# Patient Record
Sex: Male | Born: 1935 | Race: White | Hispanic: No | Marital: Married | Smoking: Former smoker
Health system: Southern US, Community
[De-identification: ages and names within clinical notes are randomized; demographics above are authoritative.]

## PROBLEM LIST (undated history)

## (undated) DIAGNOSIS — C61 Malignant neoplasm of prostate: Secondary | ICD-10-CM

## (undated) DIAGNOSIS — Z8719 Personal history of other diseases of the digestive system: Secondary | ICD-10-CM

## (undated) DIAGNOSIS — K219 Gastro-esophageal reflux disease without esophagitis: Secondary | ICD-10-CM

## (undated) DIAGNOSIS — Z87442 Personal history of urinary calculi: Secondary | ICD-10-CM

## (undated) DIAGNOSIS — M810 Age-related osteoporosis without current pathological fracture: Secondary | ICD-10-CM

## (undated) DIAGNOSIS — R519 Headache, unspecified: Secondary | ICD-10-CM

## (undated) DIAGNOSIS — G629 Polyneuropathy, unspecified: Secondary | ICD-10-CM

## (undated) DIAGNOSIS — K439 Ventral hernia without obstruction or gangrene: Secondary | ICD-10-CM

## (undated) DIAGNOSIS — I8393 Asymptomatic varicose veins of bilateral lower extremities: Secondary | ICD-10-CM

## (undated) DIAGNOSIS — R51 Headache: Secondary | ICD-10-CM

## (undated) HISTORY — PX: EYE SURGERY: SHX253

## (undated) HISTORY — PX: LAPAROSCOPIC CHOLECYSTECTOMY: SUR755

## (undated) HISTORY — PX: FRACTURE SURGERY: SHX138

## (undated) HISTORY — PX: TRANSURETHRAL RESECTION OF PROSTATE: SHX73

## (undated) HISTORY — PX: HERNIA REPAIR: SHX51

## (undated) HISTORY — PX: UMBILICAL HERNIA REPAIR: SHX196

## (undated) HISTORY — PX: KNEE ARTHROSCOPY: SHX127

## (undated) HISTORY — PX: CORNEAL TRANSPLANT: SHX108

## (undated) HISTORY — PX: CATARACT EXTRACTION, BILATERAL: SHX1313

## (undated) HISTORY — PX: THYROID SURGERY: SHX805

---

## 1993-08-10 HISTORY — PX: WRIST FRACTURE SURGERY: SHX121

## 2001-08-10 HISTORY — PX: PROSTATECTOMY: SHX69

## 2018-01-18 ENCOUNTER — Inpatient Hospital Stay (HOSPITAL_BASED_OUTPATIENT_CLINIC_OR_DEPARTMENT_OTHER)
Admission: EM | Admit: 2018-01-18 | Discharge: 2018-01-22 | DRG: 964 | Disposition: A | Discharge status: Skilled Nursing Facility | Payer: Medicare HMO | Attending: Surgery | Admitting: Surgery

## 2018-01-18 ENCOUNTER — Emergency Department (HOSPITAL_BASED_OUTPATIENT_CLINIC_OR_DEPARTMENT_OTHER): Payer: Medicare HMO

## 2018-01-18 ENCOUNTER — Other Ambulatory Visit: Payer: Self-pay

## 2018-01-18 ENCOUNTER — Encounter (HOSPITAL_BASED_OUTPATIENT_CLINIC_OR_DEPARTMENT_OTHER): Payer: Self-pay | Admitting: *Deleted

## 2018-01-18 DIAGNOSIS — H5461 Unqualified visual loss, right eye, normal vision left eye: Secondary | ICD-10-CM | POA: Diagnosis present

## 2018-01-18 DIAGNOSIS — S270XXA Traumatic pneumothorax, initial encounter: Secondary | ICD-10-CM

## 2018-01-18 DIAGNOSIS — Z87891 Personal history of nicotine dependence: Secondary | ICD-10-CM

## 2018-01-18 DIAGNOSIS — I480 Paroxysmal atrial fibrillation: Secondary | ICD-10-CM | POA: Diagnosis not present

## 2018-01-18 DIAGNOSIS — I5030 Unspecified diastolic (congestive) heart failure: Secondary | ICD-10-CM | POA: Diagnosis not present

## 2018-01-18 DIAGNOSIS — G629 Polyneuropathy, unspecified: Secondary | ICD-10-CM | POA: Diagnosis present

## 2018-01-18 DIAGNOSIS — Z9842 Cataract extraction status, left eye: Secondary | ICD-10-CM

## 2018-01-18 DIAGNOSIS — Z947 Corneal transplant status: Secondary | ICD-10-CM | POA: Diagnosis not present

## 2018-01-18 DIAGNOSIS — Y92002 Bathroom of unspecified non-institutional (private) residence single-family (private) house as the place of occurrence of the external cause: Secondary | ICD-10-CM

## 2018-01-18 DIAGNOSIS — Z8546 Personal history of malignant neoplasm of prostate: Secondary | ICD-10-CM | POA: Diagnosis not present

## 2018-01-18 DIAGNOSIS — Z885 Allergy status to narcotic agent status: Secondary | ICD-10-CM

## 2018-01-18 DIAGNOSIS — S36114A Minor laceration of liver, initial encounter: Principal | ICD-10-CM | POA: Diagnosis present

## 2018-01-18 DIAGNOSIS — Z9079 Acquired absence of other genital organ(s): Secondary | ICD-10-CM | POA: Diagnosis not present

## 2018-01-18 DIAGNOSIS — J90 Pleural effusion, not elsewhere classified: Secondary | ICD-10-CM | POA: Diagnosis present

## 2018-01-18 DIAGNOSIS — Z79899 Other long term (current) drug therapy: Secondary | ICD-10-CM

## 2018-01-18 DIAGNOSIS — W1830XA Fall on same level, unspecified, initial encounter: Secondary | ICD-10-CM | POA: Diagnosis present

## 2018-01-18 DIAGNOSIS — Z7982 Long term (current) use of aspirin: Secondary | ICD-10-CM | POA: Diagnosis not present

## 2018-01-18 DIAGNOSIS — Z9841 Cataract extraction status, right eye: Secondary | ICD-10-CM

## 2018-01-18 DIAGNOSIS — S2249XA Multiple fractures of ribs, unspecified side, initial encounter for closed fracture: Secondary | ICD-10-CM | POA: Diagnosis present

## 2018-01-18 DIAGNOSIS — I4891 Unspecified atrial fibrillation: Secondary | ICD-10-CM | POA: Diagnosis not present

## 2018-01-18 DIAGNOSIS — Z9049 Acquired absence of other specified parts of digestive tract: Secondary | ICD-10-CM | POA: Diagnosis not present

## 2018-01-18 DIAGNOSIS — J939 Pneumothorax, unspecified: Secondary | ICD-10-CM

## 2018-01-18 DIAGNOSIS — S2241XA Multiple fractures of ribs, right side, initial encounter for closed fracture: Secondary | ICD-10-CM | POA: Diagnosis present

## 2018-01-18 DIAGNOSIS — I714 Abdominal aortic aneurysm, without rupture: Secondary | ICD-10-CM | POA: Diagnosis present

## 2018-01-18 DIAGNOSIS — W19XXXA Unspecified fall, initial encounter: Secondary | ICD-10-CM

## 2018-01-18 DIAGNOSIS — G609 Hereditary and idiopathic neuropathy, unspecified: Secondary | ICD-10-CM | POA: Diagnosis not present

## 2018-01-18 DIAGNOSIS — R296 Repeated falls: Secondary | ICD-10-CM | POA: Diagnosis not present

## 2018-01-18 HISTORY — DX: Polyneuropathy, unspecified: G62.9

## 2018-01-18 HISTORY — DX: Malignant neoplasm of prostate: C61

## 2018-01-18 HISTORY — DX: Headache, unspecified: R51.9

## 2018-01-18 HISTORY — DX: Gastro-esophageal reflux disease without esophagitis: K21.9

## 2018-01-18 HISTORY — DX: Personal history of urinary calculi: Z87.442

## 2018-01-18 HISTORY — DX: Age-related osteoporosis without current pathological fracture: M81.0

## 2018-01-18 HISTORY — DX: Headache: R51

## 2018-01-18 HISTORY — DX: Ventral hernia without obstruction or gangrene: K43.9

## 2018-01-18 HISTORY — DX: Asymptomatic varicose veins of bilateral lower extremities: I83.93

## 2018-01-18 HISTORY — DX: Personal history of other diseases of the digestive system: Z87.19

## 2018-01-18 LAB — CBC WITH DIFFERENTIAL/PLATELET
Basophils Absolute: 0 10*3/uL (ref 0.0–0.1)
Basophils Relative: 0 %
EOS PCT: 0 %
Eosinophils Absolute: 0 10*3/uL (ref 0.0–0.7)
HCT: 43.3 % (ref 39.0–52.0)
Hemoglobin: 15.4 g/dL (ref 13.0–17.0)
LYMPHS PCT: 6 %
Lymphs Abs: 0.6 10*3/uL — ABNORMAL LOW (ref 0.7–4.0)
MCH: 31.9 pg (ref 26.0–34.0)
MCHC: 35.6 g/dL (ref 30.0–36.0)
MCV: 89.6 fL (ref 78.0–100.0)
Monocytes Absolute: 0.7 10*3/uL (ref 0.1–1.0)
Monocytes Relative: 6 %
Neutro Abs: 9.4 10*3/uL — ABNORMAL HIGH (ref 1.7–7.7)
Neutrophils Relative %: 88 %
PLATELETS: 185 10*3/uL (ref 150–400)
RBC: 4.83 MIL/uL (ref 4.22–5.81)
RDW: 12.9 % (ref 11.5–15.5)
WBC: 10.7 10*3/uL — AB (ref 4.0–10.5)

## 2018-01-18 LAB — COMPREHENSIVE METABOLIC PANEL
ALK PHOS: 64 U/L (ref 38–126)
ALT: 18 U/L (ref 17–63)
ANION GAP: 7 (ref 5–15)
AST: 26 U/L (ref 15–41)
Albumin: 4 g/dL (ref 3.5–5.0)
BUN: 13 mg/dL (ref 6–20)
CALCIUM: 8.9 mg/dL (ref 8.9–10.3)
CHLORIDE: 105 mmol/L (ref 101–111)
CO2: 28 mmol/L (ref 22–32)
CREATININE: 0.94 mg/dL (ref 0.61–1.24)
Glucose, Bld: 116 mg/dL — ABNORMAL HIGH (ref 65–99)
Potassium: 3.9 mmol/L (ref 3.5–5.1)
Sodium: 140 mmol/L (ref 135–145)
Total Bilirubin: 0.8 mg/dL (ref 0.3–1.2)
Total Protein: 7 g/dL (ref 6.5–8.1)

## 2018-01-18 LAB — URINALYSIS, ROUTINE W REFLEX MICROSCOPIC
BILIRUBIN URINE: NEGATIVE
Glucose, UA: NEGATIVE mg/dL
Ketones, ur: NEGATIVE mg/dL
Leukocytes, UA: NEGATIVE
Nitrite: NEGATIVE
PROTEIN: NEGATIVE mg/dL
Specific Gravity, Urine: 1.02 (ref 1.005–1.030)
pH: 7 (ref 5.0–8.0)

## 2018-01-18 LAB — URINALYSIS, MICROSCOPIC (REFLEX): WBC, UA: NONE SEEN WBC/hpf (ref 0–5)

## 2018-01-18 LAB — I-STAT CG4 LACTIC ACID, ED: LACTIC ACID, VENOUS: 0.89 mmol/L (ref 0.5–1.9)

## 2018-01-18 MED ORDER — MORPHINE SULFATE (PF) 2 MG/ML IV SOLN
2.0000 mg | INTRAVENOUS | Status: DC | PRN
  Administered 2018-01-19: 2 mg via INTRAVENOUS
  Filled 2018-01-18: qty 1

## 2018-01-18 MED ORDER — MORPHINE SULFATE (PF) 4 MG/ML IV SOLN
4.0000 mg | Freq: Once | INTRAVENOUS | Status: AC
  Administered 2018-01-18: 4 mg via INTRAVENOUS
  Filled 2018-01-18: qty 1

## 2018-01-18 MED ORDER — IOPAMIDOL (ISOVUE-300) INJECTION 61%
100.0000 mL | Freq: Once | INTRAVENOUS | Status: AC | PRN
  Administered 2018-01-18: 100 mL via INTRAVENOUS

## 2018-01-18 MED ORDER — ONDANSETRON 4 MG PO TBDP: 4.0000 mg | ORAL_TABLET | Freq: Four times a day (QID) | ORAL | Status: DC | PRN

## 2018-01-18 MED ORDER — MORPHINE SULFATE (PF) 2 MG/ML IV SOLN: 1.0000 mg | INTRAVENOUS | Status: DC | PRN

## 2018-01-18 MED ORDER — ONDANSETRON HCL 4 MG/2ML IJ SOLN
4.0000 mg | Freq: Once | INTRAMUSCULAR | Status: AC
  Administered 2018-01-18: 4 mg via INTRAVENOUS
  Filled 2018-01-18: qty 2

## 2018-01-18 MED ORDER — POTASSIUM CHLORIDE IN NACL 20-0.9 MEQ/L-% IV SOLN
INTRAVENOUS | Status: DC
  Administered 2018-01-19: via INTRAVENOUS
  Filled 2018-01-18: qty 1000

## 2018-01-18 MED ORDER — ONDANSETRON HCL 4 MG/2ML IJ SOLN: 4.0000 mg | Freq: Four times a day (QID) | INTRAMUSCULAR | Status: DC | PRN

## 2018-01-18 MED ORDER — CYCLOSPORINE 0.05 % OP EMUL
1.0000 [drp] | Freq: Two times a day (BID) | OPHTHALMIC | Status: DC
  Administered 2018-01-20 – 2018-01-22 (×2): 1 [drp] via OPHTHALMIC
  Filled 2018-01-18 (×9): qty 1

## 2018-01-18 MED ORDER — DULOXETINE HCL 60 MG PO CPEP
60.0000 mg | ORAL_CAPSULE | Freq: Every day | ORAL | Status: DC
  Administered 2018-01-19 – 2018-01-22 (×4): 60 mg via ORAL
  Filled 2018-01-18 (×5): qty 1

## 2018-01-18 MED ORDER — MORPHINE SULFATE (PF) 4 MG/ML IV SOLN: 4.0000 mg | INTRAVENOUS | Status: DC | PRN

## 2018-01-18 NOTE — ED Provider Notes (Signed)
[facility] EMERGENCY DEPARTMENT Provider Note   CSN: [id number] Arrival date & time: 01/18/18  1547     History   Chief Complaint Chief Complaint  Patient presents with  . Fall    HPI Dustin Fruth Sr. is a 84 y.o. male.  HPI   The history is provided by the patient, medical records and the spouse.  Fall  This is a new problem. The current episode started less than 1 hour ago. The problem occurs constantly. The problem has not changed since onset.Pertinent negatives include no chest pain, no abdominal pain, no headaches and no shortness of breath. The symptoms are aggravated by twisting and bending. Nothing relieves the symptoms. He has tried nothing for the symptoms. The treatment provided no relief.      Past Medical History:  Diagnosis Date  . Peripheral neuropathy   . Prostate CA [facility])     Patient Active Problem List   Diagnosis Date Noted  . Multiple rib fractures 01/18/2018  . Liver laceration, grade I 01/18/2018    Past Surgical History:  Procedure Laterality Date  . CHOLECYSTECTOMY    . PROSTATECTOMY    . THYROID SURGERY          Home Medications    Prior to Admission medications   Not on File    Family History No family history on file.  Social History Social History   Tobacco Use  . Smoking status: Not on file  Substance Use Topics  . Alcohol use: Not on file  . Drug use: Not on file     Allergies   Patient has no allergy information on record.   Review of Systems Review of Systems Review of Systems  Constitutional: Negative for chills, fatigue and fever.  HENT: Negative for congestion.   Respiratory: Negative for cough, chest tightness, shortness of breath and wheezing.   Cardiovascular: Negative for chest pain.  Gastrointestinal: Negative for abdominal pain, diarrhea, nausea and vomiting.  Genitourinary: Positive for flank pain. Negative for frequency.  Musculoskeletal: Positive for back pain. Negative for  neck stiffness.  Skin: Negative for rash and wound.  Neurological: Negative for light-headedness, numbness and headaches.  Psychiatric/Behavioral: Negative for agitation.  All other systems reviewed and are negative.   Physical Exam Updated Vital Signs BP (!) 187/124 (BP Location: Right Arm)   Pulse 96   Temp 98.3 F (36.8 C) (Oral)   Resp 20   Ht 6' (1.829 m)   Wt 108.9 kg (240 lb)   SpO2 92%   BMI 32.55 kg/m   Physical Exam Constitutional: He is oriented to person, place, and time. He appears well-developed and well-nourished. No distress.  HENT:  Head: Normocephalic and atraumatic.  Mouth/Throat: Oropharynx is clear and moist. No oropharyngeal exudate.  Eyes: Conjunctivae and EOM are normal.  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  Neck: Normal range of motion. Neck supple.  Cardiovascular: Normal rate and regular rhythm.  No murmur heard. Pulmonary/Chest: Effort normal and breath sounds normal. No stridor. No respiratory distress. He has no wheezes. He has no rales. He exhibits no tenderness.  Abdominal: Soft. Normal appearance. There is no tenderness. There is CVA tenderness. There is no guarding.    Musculoskeletal: He exhibits tenderness. He exhibits no edema.       Lumbar back: He exhibits tenderness, swelling, pain and spasm.       Back:  Lymphadenopathy:    He has no cervical adenopathy.  Neurological: He is alert and oriented to  person, place, and time. No sensory deficit. He exhibits normal muscle tone.  Skin: Skin is warm and dry. Capillary refill takes less than 2 seconds. No rash noted. He is not diaphoretic. No erythema. No pallor.  Psychiatric: He has a normal mood and affect.  Nursing note and vitals reviewed.    ED Treatments / Results  Labs (all labs ordered are listed, but only abnormal results are displayed) Labs Reviewed  CBC WITH DIFFERENTIAL/PLATELET - Abnormal; Notable for the following components:      Result Value   WBC 10.7 (*)     Neutro Abs 9.4 (*)    Lymphs Abs 0.6 (*)    All other components within normal limits  COMPREHENSIVE METABOLIC PANEL - Abnormal; Notable for the following components:   Glucose, Bld 116 (*)    All other components within normal limits  URINALYSIS, ROUTINE W REFLEX MICROSCOPIC - Abnormal; Notable for the following components:   Hgb urine dipstick SMALL (*)    All other components within normal limits  URINALYSIS, MICROSCOPIC (REFLEX) - Abnormal; Notable for the following components:   Bacteria, UA FEW (*)    All other components within normal limits  MRSA PCR SCREENING  CBC  CBC  BASIC METABOLIC PANEL  I-STAT CG4 LACTIC ACID, ED    EKG None  Radiology Ct Chest W Contrast  Result Date: 01/18/2018 CLINICAL DATA:  Fall with injury of the right mid back, right costovertebral region, and right flank. Tenderness. History of prostate cancer and prior prostatectomy. EXAM: CT CHEST, ABDOMEN, AND PELVIS WITH CONTRAST TECHNIQUE: Multidetector CT imaging of the chest, abdomen and pelvis was performed following the standard protocol during bolus administration of intravenous contrast. CONTRAST:  172mL ISOVUE-300 IOPAMIDOL (ISOVUE-300) INJECTION 61% COMPARISON:  None. FINDINGS: CT CHEST FINDINGS Cardiovascular: Coronary, aortic arch, and branch vessel atherosclerotic vascular disease. Tortuous aortic arch and branch vasculature. Mediastinum/Nodes: Right hilar lymph node 1.0 cm in short axis, image 25/2. Right infrahilar lymph node 0.9 cm in short axis, image 31/2. Left hilar lymph node 1.3 cm in short axis, image 26/2. Lungs/Pleura: There is a less than 5% right pneumothorax present anteriorly, most notable at the lung base. Trace right pleural effusion. Old granulomatous disease with calcified lymph nodes and several calcified pulmonary nodules. Mild-to-moderate peripheral interstitial accentuation in both lungs. This could reflect mild fibrosis but does not currently represent end-stage lung disease.  Bulla in the superior segment left lower lobe noted. No definite pulmonary contusion. Musculoskeletal: Displaced segmental fracture right twelfth rib. Mildly displaced segmental fracture, right eleventh rib. Mildly displaced segmental fracture right tenth rib. Displaced posterolateral fracture of the right ninth rib. Nondisplaced age-indeterminate fractures of the right eighth, seventh, sixth, and fifth ribs. Thoracic spondylosis. Multilevel bridging spurring in the thoracic spine. CT ABDOMEN PELVIS FINDINGS Hepatobiliary: In the dome of the right hepatic lobe a 8 mm hypodense lesion on image 104/5 is likely a cyst and less likely to be a small laceration given the lack of immediate proximity to the adjacent rib fractures. However, there is some trace fluid along the posterior inferior margin of the right hepatic lobe, and this portion of the liver is close to some of the rib fractures. Certainly no large laceration is evident in the liver. Prior cholecystectomy. Pancreas: Unremarkable Spleen: Old granulomatous disease. Adrenals/Urinary Tract: Hypodense lesions in the left kidney are fluid density and probably cysts, although we do not have precontrast images to directly compare. Adrenal glands normal. Stomach/Bowel: Unremarkable Vascular/Lymphatic: Aortoiliac atherosclerotic vascular disease. Atherosclerotic fusiform infrarenal  abdominal aortic aneurysm 3.3 cm anterior-posterior diameter. Reproductive: Prostatectomy. Other: No supplemental non-categorized findings. Musculoskeletal: Bridging spurring of the right sacroiliac joint. Spurring of both femoral heads and acetabula. Lumbar spondylosis and degenerative disc disease potentially with mild left foraminal stenosis at the L5-S1 level. IMPRESSION: 1. Displaced and segmental fractures involving the right lower ribs, and also suspected nondisplaced fractures of the right fifth through eighth ribs. There is a less than 5% associated right pneumothorax and trace  right pleural effusion. 2. There is also some trace fluid adjacent to the liver. Cyst or tiny laceration along the dome of the right hepatic lobe. I do not see any other potential laceration, but the trace fluid in the right paracolic gutter in proximity to the rib fractures could potentially indicate a subtle/minimal liver injury. 3. Infrarenal abdominal aortic aneurysm, 3.3 cm in diameter. Recommend followup by ultrasound in 3 years. This recommendation follows ACR consensus guidelines: White Paper of the ACR Incidental Findings Committee II on Vascular Findings. J Am Coll Radiol 2013; 37:902-409 4. Mild peripheral interstitial accentuation in the lungs suggesting mild fibrosis. 5. Mild bilateral hilar adenopathy, significance uncertain. This could be reactive. 6. Other imaging findings of potential clinical significance: Aortic Atherosclerosis (ICD10-I70.0). Coronary atherosclerosis. Old granulomatous disease. Hypodense left renal lesions are most likely to be cysts. Critical Value/emergent results were called by telephone at the time of interpretation on 01/18/2018 at 6:09 pm to Dr. Marda Stalker , who verbally acknowledged these results. Electronically Signed   By: Van Clines M.D.   On: 01/18/2018 18:10   Ct Abdomen Pelvis W Contrast  Result Date: 01/18/2018 CLINICAL DATA:  Fall with injury of the right mid back, right costovertebral region, and right flank. Tenderness. History of prostate cancer and prior prostatectomy. EXAM: CT CHEST, ABDOMEN, AND PELVIS WITH CONTRAST TECHNIQUE: Multidetector CT imaging of the chest, abdomen and pelvis was performed following the standard protocol during bolus administration of intravenous contrast. CONTRAST:  13mL ISOVUE-300 IOPAMIDOL (ISOVUE-300) INJECTION 61% COMPARISON:  None. FINDINGS: CT CHEST FINDINGS Cardiovascular: Coronary, aortic arch, and branch vessel atherosclerotic vascular disease. Tortuous aortic arch and branch vasculature.  Mediastinum/Nodes: Right hilar lymph node 1.0 cm in short axis, image 25/2. Right infrahilar lymph node 0.9 cm in short axis, image 31/2. Left hilar lymph node 1.3 cm in short axis, image 26/2. Lungs/Pleura: There is a less than 5% right pneumothorax present anteriorly, most notable at the lung base. Trace right pleural effusion. Old granulomatous disease with calcified lymph nodes and several calcified pulmonary nodules. Mild-to-moderate peripheral interstitial accentuation in both lungs. This could reflect mild fibrosis but does not currently represent end-stage lung disease. [place] in the superior segment left lower lobe noted. No definite pulmonary contusion. Musculoskeletal: Displaced segmental fracture right twelfth rib. Mildly displaced segmental fracture, right eleventh rib. Mildly displaced segmental fracture right tenth rib. Displaced posterolateral fracture of the right ninth rib. Nondisplaced age-indeterminate fractures of the right eighth, seventh, sixth, and fifth ribs. Thoracic spondylosis. Multilevel bridging spurring in the thoracic spine. CT ABDOMEN PELVIS FINDINGS Hepatobiliary: In the dome of the right hepatic lobe a 8 mm hypodense lesion on image 104/5 is likely a cyst and less likely to be a small laceration given the lack of immediate proximity to the adjacent rib fractures. However, there is some trace fluid along the posterior inferior margin of the right hepatic lobe, and this portion of the liver is close to some of the rib fractures. Certainly no large laceration is evident in the liver. Prior cholecystectomy. Pancreas: Unremarkable  Spleen: Old granulomatous disease. Adrenals/Urinary Tract: Hypodense lesions in the left kidney are fluid density and probably cysts, although we do not have precontrast images to directly compare. Adrenal glands normal. Stomach/Bowel: Unremarkable Vascular/Lymphatic: Aortoiliac atherosclerotic vascular disease. Atherosclerotic fusiform infrarenal abdominal  aortic aneurysm 3.3 cm anterior-posterior diameter. Reproductive: Prostatectomy. Other: No supplemental non-categorized findings. Musculoskeletal: Bridging spurring of the right sacroiliac joint. Spurring of both femoral heads and acetabula. Lumbar spondylosis and degenerative disc disease potentially with mild left foraminal stenosis at the L5-S1 level. IMPRESSION: 1. Displaced and segmental fractures involving the right lower ribs, and also suspected nondisplaced fractures of the right fifth through eighth ribs. There is a less than 5% associated right pneumothorax and trace right pleural effusion. 2. There is also some trace fluid adjacent to the liver. Cyst or tiny laceration along the dome of the right hepatic lobe. I do not see any other potential laceration, but the trace fluid in the right paracolic gutter in proximity to the rib fractures could potentially indicate a subtle/minimal liver injury. 3. Infrarenal abdominal aortic aneurysm, 3.3 cm in diameter. Recommend followup by ultrasound in 3 years. This recommendation follows ACR consensus guidelines: White Paper of the ACR Incidental Findings Committee II on Vascular Findings. J Am Coll Radiol 2013; 17:510-258 4. Mild peripheral interstitial accentuation in the lungs suggesting mild fibrosis. 5. Mild bilateral hilar adenopathy, significance uncertain. This could be reactive. 6. Other imaging findings of potential clinical significance: Aortic Atherosclerosis (ICD10-I70.0). Coronary atherosclerosis. Old granulomatous disease. Hypodense left renal lesions are most likely to be cysts. Critical Value/emergent results were called by telephone at the time of interpretation on 01/18/2018 at 6:09 pm to Dr. Marda Stalker , who verbally acknowledged these results. Electronically Signed   By: Van Clines M.D.   On: 01/18/2018 18:10    Procedures Procedures (including critical care time)  CRITICAL CARE Performed by: Gwenyth Allegra Pratt Bress Total  critical care time: 45 minutes Critical care time was exclusive of separately billable procedures and treating other patients. Patient with a fall with 8 rib fractures, possible flail segment, pneumothorax, liver laceration, and hypoxia requiring ICU admission. Critical care was necessary to treat or prevent imminent or life-threatening deterioration. Critical care was time spent personally by me on the following activities: development of treatment plan with patient and/or surrogate as well as nursing, discussions with consultants, evaluation of patient's response to treatment, examination of patient, obtaining history from patient or surrogate, ordering and performing treatments and interventions, ordering and review of laboratory studies, ordering and review of radiographic studies, pulse oximetry and re-evaluation of patient's condition.    Medications Ordered in ED Medications  cycloSPORINE (RESTASIS) 0.05 % ophthalmic emulsion 1 drop (1 drop Both Eyes Given 01/19/18 0052)  DULoxetine (CYMBALTA) DR capsule 60 mg (has no administration in time range)  0.9 % NaCl with KCl 20 mEq/ L  infusion ( Intravenous New Bag/Given 01/19/18 0019)  morphine 2 MG/ML injection 1 mg (has no administration in time range)  morphine 2 MG/ML injection 2 mg (has no administration in time range)  morphine 4 MG/ML injection 4 mg (has no administration in time range)  ondansetron (ZOFRAN-ODT) disintegrating tablet 4 mg (has no administration in time range)    Or  ondansetron (ZOFRAN) injection 4 mg (has no administration in time range)  MEDLINE mouth rinse (has no administration in time range)  morphine 4 MG/ML injection 4 mg (4 mg Intravenous Given 01/18/18 1647)  iopamidol (ISOVUE-300) 61 % injection 100 mL (100 mLs Intravenous Contrast Given 01/18/18  1729)  morphine 4 MG/ML injection 4 mg (4 mg Intravenous Given 01/18/18 1811)  ondansetron (ZOFRAN) injection 4 mg (4 mg Intravenous Given 01/18/18 1824)  morphine 4  MG/ML injection 4 mg (4 mg Intravenous Given 01/18/18 2201)     Initial Impression / Assessment and Plan / ED Course  I have reviewed the triage vital signs and the nursing notes.  Pertinent labs & imaging results that were available during my care of the patient were reviewed by me and considered in my medical decision making (see chart for details).      Dustin Hines is a 84 y.o. male with a past medical history significant for prior prostate cancer status post prostatectomy and cholecystectomy who presents with fall and severe right back and right flank pain.  Patient reports that he has chronic dizziness and was trying to repair a broken towel rack in his bathroom when he began falling.  He says that he tried to grab the towel rack which was not there and he fell back.  He reports hitting his right mid back and flank on the side of the bathtub.  Denies hitting his head or loss of consciousness.  He denied nausea vomiting, vision changes or headache.  He denies neck pain or neck stiffness.  He denies chest pain shortness breath or abdominal pain.  He is primarily complaining of 10 out of 10 pain in his right flank right CVA area, and right mid back.  He reports the pain is extremely pleuritic.  He has been unable to get himself off the ground and could not initially ambulate due to the pain.  He has not had pain like this before.  He denies other complaints.    On exam, patient had tenderness in his right lower back near his lower ribs.  He also had extreme tenderness and mild area of swelling in the right CVA area.  Patient had tenderness in the right flank.  Abdomen was otherwise nontender.  Patient had normal sensation and strength in lower extremity's.  Normal pulses felt in the DP arteries.  Neck was nontender.  Patient is blind in right eye but otherwise had reassuring exam.  Clinically I am concerned for injury to the patient's right lower ribs versus injury to his right kidney or right  liver.  Due to the patient's age and his exam, patient will have laboratory testing and CT scan to look for traumatic injury such as liver lacerations kidney laceration or fractures.  Patient was given pain medicine during initial work-up.  Patient also placed on 2 L nasal cannula for oxygen saturations in the upper 80s.  Next  Radiology called to report that patient has full injuries on CT imaging.  Patient was found to have a 5% pneumothorax on the right.  He was found to have rib fractures on ribs 5 through 12 on the right as well as multiple segmental ones concerning for developing flail chest.  Patient also has some fluid near his liver concerning for possible grade 1 liver laceration.  On reassessment, patient reports every time he takes of breath he feels his chest clicking and scraping, this is concerning for his segmental rib fractures.  Patient reports his pain has continued.  More morphine will be given.    Given these traumatic injuries and his new oxygen requirement, trauma will be called for admission and further management.  Trauma agreed with admission to ICU for further management.   Final Clinical Impressions(s) / ED Diagnoses  Final diagnoses:  Fall, initial encounter  Traumatic pneumothorax, initial encounter  Closed fracture of multiple ribs of right side, initial encounter    ED Discharge Orders    None      Clinical Impression: 1. Fall, initial encounter   2. Traumatic pneumothorax, initial encounter   3. Closed fracture of multiple ribs of right side, initial encounter     Disposition: Admit  This note was prepared with assistance of Dragon voice recognition software. Occasional wrong-word or sound-a-like substitutions may have occurred due to the inherent limitations of voice recognition software.              Leiani Enright, Gwenyth Allegra, MD 01/19/18 [phone]

## 2018-01-18 NOTE — H&P (Signed)
History   Rhet Rorke Sr. is an 84 y.o. male.    Transfer from [place] Complaint:  Chief Complaint  Patient presents with  . Fall    HPI This is an 84 yo male who fell in the bathroom at home this afternoon, landing on the right side of his chest.  He was brought to the ED at [facility] where he underwent work-up which showed multiple segmental rib fractures and a Grade I liver laceration.  He is being transferred to the Trauma service at [facility] for admission.  Past Medical History:  Diagnosis Date  . Peripheral neuropathy   . Prostate CA [facility])     Past Surgical History:  Procedure Laterality Date  . CHOLECYSTECTOMY    . PROSTATECTOMY    . THYROID SURGERY      History reviewed. No pertinent family history. Social History:  reports that he has quit smoking. He has never used smokeless tobacco. His alcohol and drug histories are not on file.  Allergies   Allergies  Allergen Reactions  . Hydrocodone     Home Medications   Prior to Admission medications   Medication Sig Start Date End Date Taking? Authorizing Provider  aspirin EC 81 MG tablet Take by mouth. 07/12/14 09/17/18 Yes [provider]  Besifloxacin HCl (BESIVANCE) 0.6 % SUSP  12/17/17  Yes [provider]  cycloSPORINE (RESTASIS) 0.05 % ophthalmic emulsion Place 1 drop into both eyes every 12 hours. 07/26/15  Yes [provider]  DULoxetine (CYMBALTA) 60 MG capsule Take by mouth. 12/22/16  Yes [provider]  furosemide (LASIX) 20 MG tablet Take by mouth. 01/26/17  Yes [provider]  gabapentin (NEURONTIN) 300 MG capsule Take by mouth. 01/24/16  Yes [provider]  Multiple Vitamins-Minerals (EYE VITAMINS & MINERALS) TABS Take by mouth. 04/21/17  Yes [provider]     Trauma Course   Results for orders placed or performed during the hospital encounter of 01/18/18 (from the past 48 hour(s))  Urinalysis, Routine w reflex microscopic     Status:  Abnormal   Collection Time: 01/18/18  4:32 PM  Result Value Ref Range   Color, Urine YELLOW YELLOW   APPearance CLEAR CLEAR   Specific Gravity, Urine 1.020 1.005 - 1.030   pH 7.0 5.0 - 8.0   Glucose, UA NEGATIVE NEGATIVE mg/dL   Hgb urine dipstick SMALL (A) NEGATIVE   Bilirubin Urine NEGATIVE NEGATIVE   Ketones, ur NEGATIVE NEGATIVE mg/dL   Protein, ur NEGATIVE NEGATIVE mg/dL   Nitrite NEGATIVE NEGATIVE   Leukocytes, UA NEGATIVE NEGATIVE    Comment: Performed at [facility], [address] [place], [place] [address]  Urinalysis, Microscopic (reflex)     Status: Abnormal   Collection Time: 01/18/18  4:32 PM  Result Value Ref Range   RBC / HPF 0-5 0 - 5 RBC/hpf   WBC, UA NONE SEEN 0 - 5 WBC/hpf   Bacteria, UA FEW (A) NONE SEEN   Squamous Epithelial / LPF 0-5 0 - 5    Comment: Performed at [facility], [address] [place], [place] [address]  CBC with Differential     Status: Abnormal   Collection Time: 01/18/18  4:47 PM  Result Value Ref Range   WBC 10.7 (H) 4.0 - 10.5 K/uL   RBC 4.83 4.22 - 5.81 MIL/uL   Hemoglobin 15.4 13.0 - 17.0 g/dL   HCT 43.3 39.0 - 52.0 %   MCV 89.6 78.0 -  100.0 fL   MCH 31.9 26.0 - 34.0 pg   MCHC 35.6 30.0 - 36.0 g/dL   RDW 12.9 11.5 - 15.5 %   Platelets 185 150 - 400 K/uL   Neutrophils Relative % 88 %   Neutro Abs 9.4 (H) 1.7 - 7.7 K/uL   Lymphocytes Relative 6 %   Lymphs Abs 0.6 (L) 0.7 - 4.0 K/uL   Monocytes Relative 6 %   Monocytes Absolute 0.7 0.1 - 1.0 K/uL   Eosinophils Relative 0 %   Eosinophils Absolute 0.0 0.0 - 0.7 K/uL   Basophils Relative 0 %   Basophils Absolute 0.0 0.0 - 0.1 K/uL    Comment: Performed at [facility], [address] [place], [place] [address]  Comprehensive metabolic panel     Status: Abnormal   Collection Time: 01/18/18  4:47 PM  Result Value Ref Range   Sodium 140 135 - 145 mmol/L   Potassium 3.9 3.5 - 5.1 mmol/L   Chloride 105 101 - 111 mmol/L   CO2 28 22 - 32 mmol/L     Glucose, Bld 116 (H) 65 - 99 mg/dL   BUN 13 6 - 20 mg/dL   Creatinine, Ser 0.94 0.61 - 1.24 mg/dL   Calcium 8.9 8.9 - 10.3 mg/dL   Total Protein 7.0 6.5 - 8.1 g/dL   Albumin 4.0 3.5 - 5.0 g/dL   AST 26 15 - 41 U/L   ALT 18 17 - 63 U/L   Alkaline Phosphatase 64 38 - 126 U/L   Total Bilirubin 0.8 0.3 - 1.2 mg/dL   GFR calc non Af Amer >60 >60 mL/min   GFR calc Af Amer >60 >60 mL/min    Comment: (NOTE) The eGFR has been calculated using the CKD EPI equation. This calculation has not been validated in all clinical situations. eGFR's persistently <60 mL/min signify possible Chronic Kidney Disease.    Anion gap 7 5 - 15    Comment: Performed at [facility], [address] [place], [place] [address]  I-Stat CG4 Lactic Acid, ED     Status: None   Collection Time: 01/18/18  4:52 PM  Result Value Ref Range   Lactic Acid, Venous 0.89 0.5 - 1.9 mmol/L   Ct Chest W Contrast  Result Date: 01/18/2018 CLINICAL DATA:  Fall with injury of the right mid back, right costovertebral region, and right flank. Tenderness. History of prostate cancer and prior prostatectomy. EXAM: CT CHEST, ABDOMEN, AND PELVIS WITH CONTRAST TECHNIQUE: Multidetector CT imaging of the chest, abdomen and pelvis was performed following the standard protocol during bolus administration of intravenous contrast. CONTRAST:  153m ISOVUE-300 IOPAMIDOL (ISOVUE-300) INJECTION 61% COMPARISON:  None. FINDINGS: CT CHEST FINDINGS Cardiovascular: Coronary, aortic arch, and branch vessel atherosclerotic vascular disease. Tortuous aortic arch and branch vasculature. Mediastinum/Nodes: Right hilar lymph node 1.0 cm in short axis, image 25/2. Right infrahilar lymph node 0.9 cm in short axis, image 31/2. Left hilar lymph node 1.3 cm in short axis, image 26/2. Lungs/Pleura: There is a less than 5% right pneumothorax present anteriorly, most notable at the lung base. Trace right pleural effusion. Old granulomatous disease with calcified  lymph nodes and several calcified pulmonary nodules. Mild-to-moderate peripheral interstitial accentuation in both lungs. This could reflect mild fibrosis but does not currently represent end-stage lung disease. Bulla in the superior segment left lower lobe noted. No definite pulmonary contusion. Musculoskeletal: Displaced segmental fracture right twelfth rib. Mildly displaced segmental fracture, right eleventh rib. Mildly  displaced segmental fracture right tenth rib. Displaced posterolateral fracture of the right ninth rib. Nondisplaced age-indeterminate fractures of the right eighth, seventh, sixth, and fifth ribs. Thoracic spondylosis. Multilevel bridging spurring in the thoracic spine. CT ABDOMEN PELVIS FINDINGS Hepatobiliary: In the dome of the right hepatic lobe a 8 mm hypodense lesion on image 104/5 is likely a cyst and less likely to be a small laceration given the lack of immediate proximity to the adjacent rib fractures. However, there is some trace fluid along the posterior inferior margin of the right hepatic lobe, and this portion of the liver is close to some of the rib fractures. Certainly no large laceration is evident in the liver. Prior cholecystectomy. Pancreas: Unremarkable Spleen: Old granulomatous disease. Adrenals/Urinary Tract: Hypodense lesions in the left kidney are fluid density and probably cysts, although we do not have precontrast images to directly compare. Adrenal glands normal. Stomach/Bowel: Unremarkable Vascular/Lymphatic: Aortoiliac atherosclerotic vascular disease. Atherosclerotic fusiform infrarenal abdominal aortic aneurysm 3.3 cm anterior-posterior diameter. Reproductive: Prostatectomy. Other: No supplemental non-categorized findings. Musculoskeletal: Bridging spurring of the right sacroiliac joint. Spurring of both femoral heads and acetabula. Lumbar spondylosis and degenerative disc disease potentially with mild left foraminal stenosis at the L5-S1 level. IMPRESSION: 1.  Displaced and segmental fractures involving the right lower ribs, and also suspected nondisplaced fractures of the right fifth through eighth ribs. There is a less than 5% associated right pneumothorax and trace right pleural effusion. 2. There is also some trace fluid adjacent to the liver. Cyst or tiny laceration along the dome of the right hepatic lobe. I do not see any other potential laceration, but the trace fluid in the right paracolic gutter in proximity to the rib fractures could potentially indicate a subtle/minimal liver injury. 3. Infrarenal abdominal aortic aneurysm, 3.3 cm in diameter. Recommend followup by ultrasound in 3 years. This recommendation follows ACR consensus guidelines: White Paper of the ACR Incidental Findings Committee II on Vascular Findings. J Am Coll Radiol 2013; 78:242-353 4. Mild peripheral interstitial accentuation in the lungs suggesting mild fibrosis. 5. Mild bilateral hilar adenopathy, significance uncertain. This could be reactive. 6. Other imaging findings of potential clinical significance: Aortic Atherosclerosis (ICD10-I70.0). Coronary atherosclerosis. Old granulomatous disease. Hypodense left renal lesions are most likely to be cysts. Critical Value/emergent results were called by telephone at the time of interpretation on 01/18/2018 at 6:09 pm to Dr. Marda Stalker , who verbally acknowledged these results. Electronically Signed   By: Van Clines M.D.   On: 01/18/2018 18:10   Ct Abdomen Pelvis W Contrast  Result Date: 01/18/2018 CLINICAL DATA:  Fall with injury of the right mid back, right costovertebral region, and right flank. Tenderness. History of prostate cancer and prior prostatectomy. EXAM: CT CHEST, ABDOMEN, AND PELVIS WITH CONTRAST TECHNIQUE: Multidetector CT imaging of the chest, abdomen and pelvis was performed following the standard protocol during bolus administration of intravenous contrast. CONTRAST:  165m ISOVUE-300 IOPAMIDOL (ISOVUE-300)  INJECTION 61% COMPARISON:  None. FINDINGS: CT CHEST FINDINGS Cardiovascular: Coronary, aortic arch, and branch vessel atherosclerotic vascular disease. Tortuous aortic arch and branch vasculature. Mediastinum/Nodes: Right hilar lymph node 1.0 cm in short axis, image 25/2. Right infrahilar lymph node 0.9 cm in short axis, image 31/2. Left hilar lymph node 1.3 cm in short axis, image 26/2. Lungs/Pleura: There is a less than 5% right pneumothorax present anteriorly, most notable at the lung base. Trace right pleural effusion. Old granulomatous disease with calcified lymph nodes and several calcified pulmonary nodules. Mild-to-moderate peripheral interstitial accentuation in both lungs.  This could reflect mild fibrosis but does not currently represent end-stage lung disease. Bulla in the superior segment left lower lobe noted. No definite pulmonary contusion. Musculoskeletal: Displaced segmental fracture right twelfth rib. Mildly displaced segmental fracture, right eleventh rib. Mildly displaced segmental fracture right tenth rib. Displaced posterolateral fracture of the right ninth rib. Nondisplaced age-indeterminate fractures of the right eighth, seventh, sixth, and fifth ribs. Thoracic spondylosis. Multilevel bridging spurring in the thoracic spine. CT ABDOMEN PELVIS FINDINGS Hepatobiliary: In the dome of the right hepatic lobe a 8 mm hypodense lesion on image 104/5 is likely a cyst and less likely to be a small laceration given the lack of immediate proximity to the adjacent rib fractures. However, there is some trace fluid along the posterior inferior margin of the right hepatic lobe, and this portion of the liver is close to some of the rib fractures. Certainly no large laceration is evident in the liver. Prior cholecystectomy. Pancreas: Unremarkable Spleen: Old granulomatous disease. Adrenals/Urinary Tract: Hypodense lesions in the left kidney are fluid density and probably cysts, although we do not have  precontrast images to directly compare. Adrenal glands normal. Stomach/Bowel: Unremarkable Vascular/Lymphatic: Aortoiliac atherosclerotic vascular disease. Atherosclerotic fusiform infrarenal abdominal aortic aneurysm 3.3 cm anterior-posterior diameter. Reproductive: Prostatectomy. Other: No supplemental non-categorized findings. Musculoskeletal: Bridging spurring of the right sacroiliac joint. Spurring of both femoral heads and acetabula. Lumbar spondylosis and degenerative disc disease potentially with mild left foraminal stenosis at the L5-S1 level. IMPRESSION: 1. Displaced and segmental fractures involving the right lower ribs, and also suspected nondisplaced fractures of the right fifth through eighth ribs. There is a less than 5% associated right pneumothorax and trace right pleural effusion. 2. There is also some trace fluid adjacent to the liver. Cyst or tiny laceration along the dome of the right hepatic lobe. I do not see any other potential laceration, but the trace fluid in the right paracolic gutter in proximity to the rib fractures could potentially indicate a subtle/minimal liver injury. 3. Infrarenal abdominal aortic aneurysm, 3.3 cm in diameter. Recommend followup by ultrasound in 3 years. This recommendation follows ACR consensus guidelines: White Paper of the ACR Incidental Findings Committee II on Vascular Findings. J Am Coll Radiol 2013; 43:329-518 4. Mild peripheral interstitial accentuation in the lungs suggesting mild fibrosis. 5. Mild bilateral hilar adenopathy, significance uncertain. This could be reactive. 6. Other imaging findings of potential clinical significance: Aortic Atherosclerosis (ICD10-I70.0). Coronary atherosclerosis. Old granulomatous disease. Hypodense left renal lesions are most likely to be cysts. Critical Value/emergent results were called by telephone at the time of interpretation on 01/18/2018 at 6:09 pm to Dr. Marda Stalker , who verbally acknowledged these  results. Electronically Signed   By: Van Clines M.D.   On: 01/18/2018 18:10    Review of Systems  Constitutional: Negative for weight loss.  HENT: Negative for ear discharge, ear pain, hearing loss and tinnitus.   Eyes: Negative for blurred vision, double vision, photophobia and pain.  Respiratory: Negative for cough, sputum production and shortness of breath.   Cardiovascular: Positive for chest pain.  Gastrointestinal: Negative for nausea and vomiting.  Genitourinary: Positive for flank pain. Negative for dysuria, frequency and urgency.  Musculoskeletal: Negative for back pain, falls, joint pain, myalgias and neck pain.  Neurological: Negative for dizziness, tingling, sensory change, focal weakness, loss of consciousness and headaches.  Endo/Heme/Allergies: Does not bruise/bleed easily.  Psychiatric/Behavioral: Negative for depression, memory loss and substance abuse. The patient is not nervous/anxious.     Blood pressure (!) 167/115,  pulse 90, temperature 98.3 F (36.8 C), temperature source Oral, resp. rate 19, height 6' (1.829 m), weight 108.9 kg (240 lb), SpO2 94 %. Physical Exam  Constitutional: He is oriented to person, place, and time. He appears well-developed and well-nourished. No distress.  HENT:  Head: Normocephalic and atraumatic.  Mouth/Throat: Oropharynx is clear and moist. No oropharyngeal exudate.  Eyes: Conjunctivae and EOM are normal.  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  Neck: Normal range of motion. Neck supple.  Cardiovascular: Normal rate and regular rhythm.  No murmur heard. Pulmonary/Chest: Effort normal and breath sounds normal. No stridor. No respiratory distress. He has no wheezes. He has no rales. He exhibits no tenderness.  Abdominal: Soft. Normal appearance. There is no tenderness. There is CVA tenderness. There is no guarding.    Musculoskeletal: He exhibits tenderness. He exhibits no edema.       Lumbar back: He exhibits tenderness,  swelling, pain and spasm.       Back:  Lymphadenopathy:    He has no cervical adenopathy.  Neurological: He is alert and oriented to person, place, and time. No sensory deficit. He exhibits normal muscle tone.  Skin: Skin is warm and dry. Capillary refill takes less than 2 seconds. No rash noted. He is not diaphoretic. No erythema. No pallor.  Psychiatric: He has a normal mood and affect.     Assessment/Plan Fall 1.  Grade I liver laceration - free fluid adjacent to liver 2.  Displaced segmental rib fractures - right 5-8 3.  Less than 5% right pneumothorax 4.  Trace right pleural effusion 5.  Incidental 3.3 cm AAA  Transfer to [facility] -  Monitor overnight in ICU CBC at 2300 tonight, then in AM.  [place] 01/18/2018, 6:35 PM   Procedures

## 2018-01-18 NOTE — ED Notes (Signed)
15:45 ED Triage Notes Filed Brought in by EMS from home , fall x 1 hr ago right flank on seat in shower , c/o right flank pain

## 2018-01-18 NOTE — ED Notes (Signed)
Report called to Alecia Lemming, receiving nurse at [facility]

## 2018-01-18 NOTE — ED Notes (Signed)
Please note pt arrival time and initial triage was 1540; charting was on the wrong patient/chart and has been corrected.

## 2018-01-18 NOTE — ED Notes (Signed)
Report given to [facility] with [facility]

## 2018-01-18 NOTE — ED Notes (Signed)
Pt placed on O2 2 L.

## 2018-01-19 ENCOUNTER — Encounter (HOSPITAL_COMMUNITY): Payer: Self-pay | Admitting: General Practice

## 2018-01-19 LAB — CBC
HCT: 43.6 % (ref 39.0–52.0)
HCT: 43.4 % (ref 39.0–52.0)
HEMOGLOBIN: 14.9 g/dL (ref 13.0–17.0)
Hemoglobin: 15 g/dL (ref 13.0–17.0)
MCH: 31.3 pg (ref 26.0–34.0)
MCH: 31.2 pg (ref 26.0–34.0)
MCHC: 34.6 g/dL (ref 30.0–36.0)
MCHC: 34.2 g/dL (ref 30.0–36.0)
MCV: 91.2 fL (ref 78.0–100.0)
MCV: 90.4 fL (ref 78.0–100.0)
PLATELETS: 190 10*3/uL (ref 150–400)
Platelets: 187 10*3/uL (ref 150–400)
RBC: 4.78 MIL/uL (ref 4.22–5.81)
RBC: 4.8 MIL/uL (ref 4.22–5.81)
RDW: 12.3 % (ref 11.5–15.5)
RDW: 12.4 % (ref 11.5–15.5)
WBC: 9.4 10*3/uL (ref 4.0–10.5)
WBC: 9.1 10*3/uL (ref 4.0–10.5)

## 2018-01-19 LAB — BASIC METABOLIC PANEL
Anion gap: 8 (ref 5–15)
BUN: 8 mg/dL (ref 6–20)
CALCIUM: 8.8 mg/dL — AB (ref 8.9–10.3)
CO2: 28 mmol/L (ref 22–32)
Chloride: 103 mmol/L (ref 101–111)
Creatinine, Ser: 0.8 mg/dL (ref 0.61–1.24)
GFR calc Af Amer: >60 mL/min (ref >60)
GLUCOSE: 139 mg/dL — AB (ref 65–99)
POTASSIUM: 4.3 mmol/L (ref 3.5–5.1)
SODIUM: 139 mmol/L (ref 135–145)

## 2018-01-19 LAB — MRSA PCR SCREENING: MRSA BY PCR: NEGATIVE

## 2018-01-19 LAB — GLUCOSE, CAPILLARY: GLUCOSE-CAPILLARY: 149 mg/dL — AB (ref 65–99)

## 2018-01-19 MED ORDER — ASPIRIN EC 81 MG PO TBEC
81.0000 mg | DELAYED_RELEASE_TABLET | Freq: Every day | ORAL | Status: DC
  Administered 2018-01-19 – 2018-01-22 (×4): 81 mg via ORAL
  Filled 2018-01-19 (×4): qty 1

## 2018-01-19 MED ORDER — KETOROLAC TROMETHAMINE 30 MG/ML IJ SOLN
30.0000 mg | Freq: Once | INTRAMUSCULAR | Status: AC
Start: 2018-01-19 — End: 2018-01-19
  Administered 2018-01-19: 30 mg via INTRAVENOUS
  Filled 2018-01-19: qty 1

## 2018-01-19 MED ORDER — TRAMADOL HCL 50 MG PO TABS
50.0000 mg | ORAL_TABLET | Freq: Four times a day (QID) | ORAL | Status: DC
  Administered 2018-01-19: 50 mg via ORAL
  Filled 2018-01-19: qty 1

## 2018-01-19 MED ORDER — TRAMADOL HCL 50 MG PO TABS
50.0000 mg | ORAL_TABLET | Freq: Four times a day (QID) | ORAL | Status: DC
  Administered 2018-01-19 – 2018-01-22 (×12): 50 mg via ORAL
  Filled 2018-01-19 (×12): qty 1

## 2018-01-19 MED ORDER — ORAL CARE MOUTH RINSE
15.0000 mL | Freq: Two times a day (BID) | OROMUCOSAL | Status: DC
  Administered 2018-01-19 – 2018-01-22 (×6): 15 mL via OROMUCOSAL

## 2018-01-19 MED ORDER — HYDROMORPHONE HCL 1 MG/ML IJ SOLN: 0.5000 mg | INTRAMUSCULAR | Status: DC | PRN

## 2018-01-19 MED ORDER — HYDROMORPHONE HCL 2 MG/ML IJ SOLN: 0.5000 mg | INTRAMUSCULAR | Status: DC | PRN

## 2018-01-19 MED ORDER — FUROSEMIDE 20 MG PO TABS
20.0000 mg | ORAL_TABLET | Freq: Every day | ORAL | Status: DC
  Administered 2018-01-20 – 2018-01-22 (×3): 20 mg via ORAL
  Filled 2018-01-19 (×4): qty 1

## 2018-01-19 MED ORDER — GABAPENTIN 300 MG PO CAPS
300.0000 mg | ORAL_CAPSULE | Freq: Two times a day (BID) | ORAL | Status: DC
  Administered 2018-01-19 – 2018-01-22 (×7): 300 mg via ORAL
  Filled 2018-01-19 (×7): qty 1

## 2018-01-19 MED ORDER — OXYCODONE HCL 5 MG PO TABS
5.0000 mg | ORAL_TABLET | ORAL | Status: DC | PRN
Start: 2018-01-19 — End: 2018-01-20
  Administered 2018-01-20: 5 mg via ORAL
  Filled 2018-01-19: qty 1

## 2018-01-19 MED ORDER — ACETAMINOPHEN 500 MG PO TABS
1000.0000 mg | ORAL_TABLET | Freq: Three times a day (TID) | ORAL | Status: DC
  Administered 2018-01-19 – 2018-01-22 (×10): 1000 mg via ORAL
  Filled 2018-01-19 (×10): qty 2

## 2018-01-19 MED ORDER — ENOXAPARIN SODIUM 40 MG/0.4ML ~~LOC~~ SOLN
40.0000 mg | SUBCUTANEOUS | Status: DC
  Administered 2018-01-19 – 2018-01-22 (×4): 40 mg via SUBCUTANEOUS
  Filled 2018-01-19 (×4): qty 0.4

## 2018-01-19 MED FILL — Ondansetron HCl Inj 4 MG/2ML (2 MG/ML): INTRAMUSCULAR | Qty: 2 | Status: AC

## 2018-01-19 NOTE — Progress Notes (Signed)
Patient missing his second pair of glasses that he says were taken by the RN and placed on the table in the room in the ED at [place]. [place] ED called. They transferred me to the security office who says they do not have any glasses with them. Patient and wife updated at bedside. Carlosdaniel Grob, Rande Brunt, RN

## 2018-01-19 NOTE — Progress Notes (Signed)
Patient did well with his incentive spirometer achieving 1500 the first few attempts. Pt also educated on how to splint when he coughs to decrease pain. Wife is at the bedside and in understanding as well. Patient with a bed on [place]. Will call report in a few minutes. Jyla Hopf, Rande Brunt, RN

## 2018-01-20 ENCOUNTER — Inpatient Hospital Stay (HOSPITAL_COMMUNITY): Payer: Medicare HMO

## 2018-01-20 LAB — BASIC METABOLIC PANEL
ANION GAP: 8 (ref 5–15)
BUN: 18 mg/dL (ref 6–20)
CALCIUM: 9.3 mg/dL (ref 8.9–10.3)
CO2: 31 mmol/L (ref 22–32)
Chloride: 101 mmol/L (ref 101–111)
Creatinine, Ser: 1.17 mg/dL (ref 0.61–1.24)
GFR calc non Af Amer: 55 mL/min — ABNORMAL LOW (ref >60)
Glucose, Bld: 106 mg/dL — ABNORMAL HIGH (ref 65–99)
POTASSIUM: 4 mmol/L (ref 3.5–5.1)
Sodium: 140 mmol/L (ref 135–145)

## 2018-01-20 LAB — CBC WITH DIFFERENTIAL/PLATELET
ABS IMMATURE GRANULOCYTES: 0.1 10*3/uL (ref 0.0–0.1)
BASOS PCT: 1 %
Basophils Absolute: 0 10*3/uL (ref 0.0–0.1)
Eosinophils Absolute: 0 10*3/uL (ref 0.0–0.7)
Eosinophils Relative: 1 %
HCT: 41.4 % (ref 39.0–52.0)
Hemoglobin: 14 g/dL (ref 13.0–17.0)
IMMATURE GRANULOCYTES: 1 %
LYMPHS ABS: 0.9 10*3/uL (ref 0.7–4.0)
Lymphocytes Relative: 15 %
MCH: 31.3 pg (ref 26.0–34.0)
MCHC: 33.8 g/dL (ref 30.0–36.0)
MCV: 92.4 fL (ref 78.0–100.0)
MONOS PCT: 13 %
Monocytes Absolute: 0.8 10*3/uL (ref 0.1–1.0)
NEUTROS ABS: 4.3 10*3/uL (ref 1.7–7.7)
NEUTROS PCT: 69 %
Platelets: 185 10*3/uL (ref 150–400)
RBC: 4.48 MIL/uL (ref 4.22–5.81)
RDW: 12.6 % (ref 11.5–15.5)
WBC: 6.1 10*3/uL (ref 4.0–10.5)

## 2018-01-20 MED ORDER — OXYCODONE HCL 5 MG PO TABS
5.0000 mg | ORAL_TABLET | ORAL | Status: DC | PRN
  Administered 2018-01-21: 5 mg via ORAL
  Filled 2018-01-20: qty 1

## 2018-01-20 NOTE — Evaluation (Signed)
Physical Therapy Evaluation Patient Details Name: Dustin Mccord Sr. MRN: [id number] DOB: 06/04/1930 Today's Date: 01/20/2018   History of Present Illness  This is an 84 yo male who fell in the bathroom at home this afternoon, landing on the right side of his chest.  He was brought to the ED at [facility] where he underwent work-up which showed multiple segmental rib fractures and a Grade I liver laceration, transferred to Trauma service at [facility] for admission; PMH:  peripheral neuropathy, remote compound fx L forearm d/t mechanical fall  Clinical Impression  Pt admitted with above diagnosis. Pt currently with functional limitations due to the deficits listed below (see PT Problem List).  Pt requiring overall min assist today, amb ~75' with RW--more steady with incr distance, SpO2= 94-95% on RA; pt will likely be able to return to ILF with HHPT, he may have a 24 hour SNF admit as [facility] does this sometimes as a precaution after hospitalization--PT in agreement if this is an option; will follow in acute setting  Pt will benefit from skilled PT to increase their independence and safety with mobility to allow discharge to the venue listed below.       Follow Up Recommendations Home health PT(may have 24hr SNF admit at [facility])    Equipment Recommendations  Rolling walker with 5" wheels    Recommendations for Other Services       Precautions / Restrictions Precautions Precautions: Fall Restrictions Weight Bearing Restrictions: No      Mobility  Bed Mobility               General bed mobility comments: pt received in recliner  Transfers Overall transfer level: Needs assistance Equipment used: Rolling walker (2 wheeled) Transfers: Sit to/from Stand Sit to Stand: Min assist;Mod assist         General transfer comment: assist to rise and transition to RW, posterior LOB initally (pt aware); incr time and cues for hand placement, safety  Ambulation/Gait Ambulation/Gait  assistance: Min assist Gait Distance (Feet): 75 Feet Assistive device: Rolling walker (2 wheeled) Gait Pattern/deviations: Step-through pattern;Decreased stride length;Drifts right/left;Wide base of support     General Gait Details: cues for RW position especially with turns, assist intermittently d/t posterior lean, balance improved with distance; SpO2= 94-95% on RA throughout session (nsg made aware)  Stairs            Wheelchair Mobility    Modified Rankin (Stroke Patients Only)       Balance Overall balance assessment: History of Falls                                           Pertinent Vitals/Pain Pain Assessment: 0-10 Pain Score: 2  Pain Location: R "kidney" Pain Descriptors / Indicators: Sore Pain Intervention(s): Monitored during session    Home Living Family/patient expects to be discharged to:: Private residence Living Arrangements: Spouse/significant other Available Help at Discharge: Family Type of Home: Apartment(ILF--[place])       Home Layout: One level Home Equipment: Cane - single point      Prior Function Level of Independence: Independent;Independent with assistive device(s)         Comments: amb with cane     Hand Dominance        Extremity/Trunk Assessment   Upper Extremity Assessment Upper Extremity Assessment: Defer to OT evaluation    Lower Extremity  Assessment Lower Extremity Assessment: Overall WFL for tasks assessed(hips not fully tested d/t rib pain)       Communication   Communication: No difficulties  Cognition Arousal/Alertness: Awake/alert Behavior During Therapy: WFL for tasks assessed/performed Overall Cognitive Status: No family/caregiver present to determine baseline cognitive functioning Area of Impairment: Memory;Safety/judgement;Following commands;Attention;Problem solving                   Current Attention Level: Selective Memory: Decreased short-term  memory Following Commands: Follows one step commands consistently;Follows multi-step commands inconsistently Safety/Judgement: Decreased awareness of safety   Problem Solving: Difficulty sequencing;Requires verbal cues General Comments: pt repeats same info multiple times during today's session; confusing urinal and IS; alert and oriented x3; difficulty sequencing transfers, corrects with verbal cues and begins to self cue end of session      General Comments      Exercises     Assessment/Plan    PT Assessment Patient needs continued PT services  PT Problem List Decreased activity tolerance;Decreased balance;Decreased mobility;Decreased knowledge of use of DME;Pain       PT Treatment Interventions DME instruction;Gait training;Functional mobility training;Therapeutic activities;Therapeutic exercise;Patient/family education;Balance training    PT Goals (Current goals can be found in the Care Plan section)  Acute Rehab PT Goals Patient Stated Goal: home soon PT Goal Formulation: With patient Time For Goal Achievement: 01/27/18 Potential to Achieve Goals: Good    Frequency Min 3X/week   Barriers to discharge        Co-evaluation               AM-PAC PT "6 Clicks" Daily Activity  Outcome Measure Difficulty turning over in bed (including adjusting bedclothes, sheets and blankets)?: A Lot Difficulty moving from lying on back to sitting on the side of the bed? : A Lot Difficulty sitting down on and standing up from a chair with arms (e.g., wheelchair, bedside commode, etc,.)?: A Little Help needed moving to and from a bed to chair (including a wheelchair)?: A Little Help needed walking in hospital room?: A Little Help needed climbing 3-5 steps with a railing? : A Lot 6 Click Score: 15    End of Session Equipment Utilized During Treatment: Gait belt Activity Tolerance: Patient tolerated treatment well Patient left: in chair;with call bell/phone within reach Nurse  Communication: Mobility status PT Visit Diagnosis: Unsteadiness on feet (R26.81)    Time: 8242-3536 PT Time Calculation (min) (ACUTE ONLY): 35 min   Charges:   PT Evaluation $PT Eval Low Complexity: 1 Low PT Treatments $Gait Training: 8-22 mins   PT G CodesKenyon Ana, PT Pager: [phone] 01/20/2018   [facility] 01/20/2018, 10:59 AM

## 2018-01-20 NOTE — Progress Notes (Signed)
    CC: fall   Subjective: He really looks fine, still has O2 on and has not been checked with it off.  Does ok in bed, not able to walk yet.  Going down for xray now.  Tolerating diet. Will get OT/PT involved today  Objective: Vital signs in last 24 hours: Temp:  [97.7 F (36.5 C)-98.7 F (37.1 C)] 98 F (36.7 C) (06/13 0636) Pulse Rate:  [79-90] 87 (06/13 0636) Resp:  [11-26] 17 (06/13 0636) BP: (92-161)/(63-117) 149/82 (06/13 0636) SpO2:  [92 %-100 %] 95 % (06/13 0636) Last BM Date: 01/18/18 PO 720 IV 117 Voided x 3 recorded No BM recorded Pain:  Tylenol 1 gm q8h, cymbalta 60 qd, Neurontin 300 BID, morphine x 1 yesterday, Ultram 50 x 3 yesterday Afebrile, VSS Labs pending CXR pending Intake/Output from previous day: 06/12 0701 - 06/13 0700 In: 837.5 [P.O.:720; I.V.:117.5] Out: -  Intake/Output this shift: No intake/output data recorded.  General appearance: alert, cooperative, no distress and sore, he can pull overhead, but needs help sitting up. Resp: clear to auscultation bilaterally and down in both bases Chest wall: no tenderness, right sided chest wall tenderness GI: soft, non-tender; bowel sounds normal; no masses,  no organomegaly Extremities: extremities normal, atraumatic, no cyanosis or edema and he has some ecchymosis right toes, but he says that is always therel  Lab Results:  Recent Labs    01/19/18 0051 01/19/18 0519  WBC 9.4 9.1  HGB 14.9 15.0  HCT 43.6 43.4  PLT 187 190    BMET Recent Labs    01/18/18 1647 01/19/18 0519  NA 140 139  K 3.9 4.3  CL 105 103  CO2 28 28  GLUCOSE 116* 139*  BUN 13 8  CREATININE 0.94 0.80  CALCIUM 8.9 8.8*   PT/INR No results for input(s): LABPROT, INR in the last 72 hours.  Recent Labs  Lab 01/18/18 1647  AST 26  ALT 18  ALKPHOS 64  BILITOT 0.8  PROT 7.0  ALBUMIN 4.0     Lipase  No results found for: LIPASE   Medications: . acetaminophen  1,000 mg Oral Q8H  . aspirin EC  81 mg Oral  Daily  . cycloSPORINE  1 drop Both Eyes BID  . DULoxetine  60 mg Oral Daily  . enoxaparin (LOVENOX) injection  40 mg Subcutaneous Q24H  . furosemide  20 mg Oral Daily  . gabapentin  300 mg Oral BID  . mouth rinse  15 mL Mouth Rinse BID  . traMADol  50 mg Oral Q6H    Anti-infectives (From admission, onward)   None      Assessment/Plan Hx of peripheral neuropathy Hx prostate cancer Body mass index is 33.25    Fall Displaced right rib fractures 5-9 Trace right pleural effusion/ <5% right pneumothorax Grade I liver laceration  Hx of peripheral neuropathy Hx prostate cancer  XKG:YJEHUDJ diet ID: None DVT: Lovenox Follow up:  TBD  Plan:  Labs and CXR pending.  OT/PT today.  We will see if he needs the O2.  He wants to go home but I could never get him to tell me who is at home with him.  He is a pretty big man so he will need someone with some size to help him at home.      LOS: 2 days    Zonya Gudger 01/20/2018 [phone]

## 2018-01-20 NOTE — Discharge Instructions (Signed)
Rib Fracture You have broken your right 5th, 6th, 7th, 8th and 9th ribs.  Expect it to hurt for the next 2 months. You can't really fix this they just have to heal on their own.  Continue to to the incentive spirometry at home as you were instructed in the hospital.   A rib fracture is a break or crack in one of the bones of the ribs. The ribs are like a cage that goes around your upper chest. A broken or cracked rib is often painful, but most do not cause other problems. Most rib fractures heal on their own in 1-3 months. Follow these instructions at home:  Avoid activities that cause pain to the injured area. Protect your injured area.  Slowly increase activity as told by your doctor.  Take medicine as told by your doctor.  Put ice on the injured area for the first 1-2 days after you have been treated or as told by your doctor. ? Put ice in a plastic bag. ? Place a towel between your skin and the bag. ? Leave the ice on for 15-20 minutes at a time, every 2 hours while you are awake.  Do deep breathing as told by your doctor. You may be told to: ? Take deep breaths many times a day. ? Cough many times a day while hugging a pillow. ? Use a device (incentive spirometer) to perform deep breathing many times a day.  Drink enough fluids to keep your pee (urine) clear or pale yellow.  Do not wear a rib belt or binder. These do not allow you to breathe deeply. Get help right away if:  You have a fever.  You have trouble breathing.  You cannot stop coughing.  You cough up thick or bloody spit (mucus).  You feel sick to your stomach (nauseous), throw up (vomit), or have belly (abdominal) pain.  Your pain gets worse and medicine does not help. This information is not intended to replace advice given to you by your health care provider. Make sure you discuss any questions you have with your health care provider. Document Released: 05/05/2008 Document Revised: 01/02/2016 Document  Reviewed: 09/28/2012 Elsevier Interactive Patient Education  [employer].

## 2018-01-20 NOTE — Care Management Note (Signed)
Case Management Note  Patient Details  Name: Dustin Trow Sr. MRN: [id number] Date of Birth: 06-23-1930  Subjective/Objective:                    Action/Plan:  Spoke to patient and wife at bedside regarding home health services. Provided list of choice. They are undecided. Wife requesting to speak to PA/MD regarding her husband's medical condition. Message given to Theodoro Grist  Expected Discharge Date:                  Expected Discharge Plan:  [place]  In-House Referral:     Discharge planning Services  CM Consult  Post Acute Care Choice:  Durable Medical Equipment, Home Health Choice offered to:  Patient, Spouse  DME Arranged:  Walker rolling DME Agency:  [place]:  PT [facility] Agency:     Status of Service:  In process, will continue to follow  If discussed at Long Length of Stay Meetings, dates discussed:    Additional Comments:  Marilu Favre, RN 01/20/2018, 2:54 PM

## 2018-01-20 NOTE — Discharge Summary (Addendum)
Physician Discharge Summary  Patient ID: Dustin Popper Sr. MRN: [id number] DOB/AGE: 03-18-1930 84 y.o.  Admit date: 01/18/2018 Discharge date: 01/22/2018  Admission Diagnoses:  Fall - chronic balance issues Displaced right rib fractures 5-9 Trace right pleural effusion/ <5% right pneumothorax Grade I liver laceration Hx of peripheral neuropathy Hx prostate cancer Body mass index is 33.25   Discharge Diagnoses:  Fall - chronic balance issues Displaced right rib fractures 5-9 Trace right pleural effusion/ <5% right pneumothorax Grade I liver laceration Hx of peripheral neuropathy Hx prostate cancer Body mass index is 33.25  New AF  Active Problems:   Multiple rib fractures   Liver laceration, grade I   PROCEDURES: none  Hospital Course: This is an 84 yo male who fell in the bathroom at home this afternoon, landing on the right side of his chest.  He was brought to the ED at [facility] where he underwent work-up which showed multiple segmental rib fractures and a Grade I liver laceration.  He was transferred to [facility] for admission to the trauma service.  Work up in the ED showed a possible grade 1 liver laceration.  Displaced 5-8th rib fractures on the right, less than 5% pneumothorax, and small trace effusion. He was admitted for pain control, pulmonary toilet, and to monitor possible liver laceration.  He was also found to have an incidental 3.3 cm AAA.  He has been hemodynamically stable, tolerating a diet without issue.  PT/OT therapies have been started.  Follow up chest x-ray was stable, no PTX noted.  His H/H is stable, and no complaints of abdominal pain.  Pain is all primarily in his right ribs. On 01/22/2018 patient is discharged to SNF in stable condition. Follow up is as outlined below.     Discharge Instructions    Call MD for:  difficulty breathing, headache or visual disturbances   Complete by:  As directed    Call MD for:  extreme fatigue   Complete by:   As directed    Call MD for:  persistant dizziness or light-headedness   Complete by:  As directed    Call MD for:  persistant nausea and vomiting   Complete by:  As directed    Call MD for:  severe uncontrolled pain   Complete by:  As directed    Call MD for:  temperature >100.4   Complete by:  As directed    Diet - low sodium heart healthy   Complete by:  As directed    Increase activity slowly   Complete by:  As directed      Allergies as of 01/21/2018      Reactions   Hydrocodone Rash   Mild rash one time, but has taken other agents with no reaction     Allergies as of 01/22/2018      Reactions   Hydrocodone Rash   Mild rash one time, but has taken other agents with no reaction      Medication List    TAKE these medications   acetaminophen 500 MG tablet Commonly known as:  TYLENOL Take 2 tablets (1,000 mg total) by mouth every 8 (eight) hours as needed for moderate pain.   aspirin EC 81 MG tablet Take 81 mg by mouth daily.   BESIVANCE 0.6 % Susp Generic drug:  Besifloxacin HCl Place 1 drop into the right eye 4 (four) times daily.   cycloSPORINE 0.05 % ophthalmic emulsion Commonly known as:  RESTASIS Place 1 drop into both  eyes every 12 hours.   DULoxetine 60 MG capsule Commonly known as:  CYMBALTA Take by mouth.   EYE VITAMINS & MINERALS Tabs Take by mouth.   furosemide 20 MG tablet Commonly known as:  LASIX Take by mouth.   gabapentin 300 MG capsule Commonly known as:  NEURONTIN Take 300 mg by mouth 2 (two) times daily.   natamycin 5 % ophthalmic suspension Commonly known as:  NATACYN Place 1 drop into the right eye every 2 (two) hours while awake.            Durable Medical Equipment  (From admission, onward)        Start     Ordered   01/20/18 1512  For home use only DME 3 n 1  Once     01/20/18 1511   01/20/18 1218  For home use only DME Walker rolling  Once    Comments:  To help patient transfer and ambulate.  Physical /  Occupational Therapy may change type of walker PRN.  Question Answer Comment  Patient needs a walker to treat with the following condition Fall   Patient needs a walker to treat with the following condition Multiple rib fractures      01/20/18 1218                Durable Medical Equipment  (From admission, onward)        Start     Ordered   01/20/18 1512  For home use only DME 3 n 1  Once     01/20/18 1511   01/20/18 1218  For home use only DME Walker rolling  Once    Comments:  To help patient transfer and ambulate.  Physical / Occupational Therapy may change type of walker PRN.  Question Answer Comment  Patient needs a walker to treat with the following condition Fall   Patient needs a walker to treat with the following condition Multiple rib fractures      01/20/18 1218      Contact information for follow-up providers    Javier Glazier, MD Follow up.   Specialty:  Internal Medicine Why:  Call and make a follow up appointment with them for 1-2 weeks.  Let them look into your balance issues.  Assist with more pain medicine if needed.   Contact information: [address] [place] [address] [phone]        [place] Follow up.   Why:  Call as needed, no follow up appointment scheduled. Contact information: [place] [address] [phone]       Belva Crome, MD Follow up.   Specialty:  Cardiology Why:  OFFICE WILL CALL WITH TIME AND DATE OF MONITOR SET UP AND ECHOCARDIOGRAM  Contact information: [address] [place] [address] [phone]            Contact information for after-discharge care    Destination    [facility] .   Service:  Skilled Nursing Contact information: [place] [phone]                  Signed:

## 2018-01-21 ENCOUNTER — Other Ambulatory Visit: Payer: Self-pay | Admitting: Physician Assistant

## 2018-01-21 DIAGNOSIS — G609 Hereditary and idiopathic neuropathy, unspecified: Secondary | ICD-10-CM

## 2018-01-21 DIAGNOSIS — R296 Repeated falls: Secondary | ICD-10-CM

## 2018-01-21 DIAGNOSIS — I480 Paroxysmal atrial fibrillation: Secondary | ICD-10-CM

## 2018-01-21 DIAGNOSIS — I714 Abdominal aortic aneurysm, without rupture: Secondary | ICD-10-CM

## 2018-01-21 DIAGNOSIS — I4891 Unspecified atrial fibrillation: Secondary | ICD-10-CM

## 2018-01-21 LAB — CBC
HCT: 41 % (ref 39.0–52.0)
Hemoglobin: 13.6 g/dL (ref 13.0–17.0)
MCH: 30.8 pg (ref 26.0–34.0)
MCHC: 33.2 g/dL (ref 30.0–36.0)
MCV: 93 fL (ref 78.0–100.0)
PLATELETS: 192 10*3/uL (ref 150–400)
RBC: 4.41 MIL/uL (ref 4.22–5.81)
RDW: 12.7 % (ref 11.5–15.5)
WBC: 7 10*3/uL (ref 4.0–10.5)

## 2018-01-21 MED ORDER — METOPROLOL TARTRATE 25 MG PO TABS
25.0000 mg | ORAL_TABLET | Freq: Once | ORAL | Status: AC
  Administered 2018-01-21: 25 mg via ORAL
  Filled 2018-01-21: qty 1

## 2018-01-21 NOTE — NC FL2 (Signed)
[place] LEVEL OF CARE SCREENING TOOL     IDENTIFICATION  Patient Name: Dustin Sian Sr. Birthdate: 01/17/1930 Sex: male Admission Date (Current Location): 01/18/2018  [facility] and [place] Number:  Herbalist and Address:  [facility]. [facility], [place] [address], [place], [place] [address]      Provider Number: [id number]  Attending Physician Name and Address:  Md, Trauma, MD  Relative Name and Phone Number:  Stanford Strauch, wife    Current Level of Care: Hospital Recommended Level of Care: [place] Prior Approval Number:    Date Approved/Denied:   PASRR Number: [id number]  Discharge Plan: SNF    Current Diagnoses: Patient Active Problem List   Diagnosis Date Noted  . Multiple rib fractures 01/18/2018  . Liver laceration, grade I 01/18/2018    Orientation RESPIRATION BLADDER Height & Weight     Self, Time, Situation, Place  Normal Incontinent Weight: 245 lb 2.4 oz (111.2 kg) Height:  6' (182.9 cm)  BEHAVIORAL SYMPTOMS/MOOD NEUROLOGICAL BOWEL NUTRITION STATUS      Continent Diet(see discharge summary)  AMBULATORY STATUS COMMUNICATION OF NEEDS Skin   Limited Assist Verbally Normal(ecchymosis)                       Personal Care Assistance Level of Assistance  Feeding, Bathing, Dressing Bathing Assistance: Limited assistance Feeding assistance: Independent Dressing Assistance: Limited assistance     Functional Limitations Info  Sight, Hearing, Speech 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) Hearing Info: Impaired(wears hearing aids) Speech Info: Adequate    SPECIAL CARE FACTORS FREQUENCY  PT (By licensed PT), OT (By licensed OT)     PT Frequency: 5x week OT Frequency: 5x week            Contractures Contractures Info: Not present    Additional Factors Info  Code Status, Allergies, Psychotropic Code Status Info: Full Code Allergies Info: Hydrocodone Psychotropic Info: DULoxetine  (CYMBALTA) DR capsule 60 mg daily PO         Current Medications (01/21/2018):  This is the current hospital active medication list Current Facility-Administered Medications  Medication Dose Route Frequency Provider Last Rate Last Dose  . acetaminophen (TYLENOL) tablet 1,000 mg  1,000 mg Oral Q8H Judeth Horn, MD   1,000 mg at 01/21/18 0503  . aspirin EC tablet 81 mg  81 mg Oral Daily Judeth Horn, MD   81 mg at 01/21/18 1023  . cycloSPORINE (RESTASIS) 0.05 % ophthalmic emulsion 1 drop  1 drop Both Eyes BID Judeth Horn, MD   1 drop at 01/20/18 1038  . DULoxetine (CYMBALTA) DR capsule 60 mg  60 mg Oral Daily Judeth Horn, MD   60 mg at 01/21/18 1023  . enoxaparin (LOVENOX) injection 40 mg  40 mg Subcutaneous Q24H Judeth Horn, MD   40 mg at 01/21/18 0736  . furosemide (LASIX) tablet 20 mg  20 mg Oral Daily Judeth Horn, MD   20 mg at 01/21/18 1023  . gabapentin (NEURONTIN) capsule 300 mg  300 mg Oral BID Judeth Horn, MD   300 mg at 01/21/18 1023  . HYDROmorphone (DILAUDID) injection 0.5 mg  0.5 mg Intravenous Q3H PRN Georganna Skeans, MD      . MEDLINE mouth rinse  15 mL Mouth Rinse BID Judeth Horn, MD   15 mL at 01/20/18 2201  . ondansetron (ZOFRAN-ODT) disintegrating tablet 4 mg  4 mg Oral Q6H PRN Judeth Horn, MD  Or  . ondansetron (ZOFRAN) injection 4 mg  4 mg Intravenous Q6H PRN Judeth Horn, MD      . oxyCODONE (Oxy IR/ROXICODONE) immediate release tablet 5 mg  5 mg Oral Q4H PRN Earnstine Regal, PA-C   5 mg at 01/21/18 0203  . traMADol (ULTRAM) tablet 50 mg  50 mg Oral Q6H Georganna Skeans, MD   50 mg at 01/21/18 1023     Discharge Medications: Please see discharge summary for a list of discharge medications.  Relevant Imaging Results:  Relevant Lab Results:   Additional Information SS# [place], [place]

## 2018-01-21 NOTE — Progress Notes (Signed)
EKG completed. Results in Epic and paper copy placed on chart. BP 117/76.  MD White notified. Administered Metoprolol 25mg  po once. Will continue to monitor patient.

## 2018-01-21 NOTE — Social Work (Addendum)
CSW completed SBIRT with pt, pt states he does drink but was not consuming ETOH prior to fall. Pt and pt wife state no concerns with substance use and no further needs identified, pt is discharging to SNF at [facility]. Clinicals have been sent to [facility], determination not made currently, however pt qualifies for SNF tomorrow even if determination has not yet been made.   Pt discharge information will need to be faxed to [phone].  RN to call report to [place] [phone] whenever pt discharge complete.   Please call weekend CSW at [phone] to facilitate discharge.  [place], [place] Work [phone]

## 2018-01-21 NOTE — Progress Notes (Signed)
CCMD called to notify nurse that patient has converted into A-fib. Patient a little tachy on monitor fluctuating from 104 to 114. Patient states that he feels ok and his heart doesn't feel any different. Notified MD White on call. Ordered to get EKG and vital signs.

## 2018-01-21 NOTE — Consult Note (Addendum)
The patient has been seen in conjunction with B. Bhagat, PAC. All aspects of care have been considered and discussed. The patient has been personally interviewed, examined, and all clinical data has been reviewed.   Patient was admitted with a fall, associated with loss of balance and not syncope.  Noted to have asymptomatic atrial fibrillation after being placed on telemetry unit.  While in atrial fibrillation there is no evidence of rapid ventricular response.  Diagnosis is that of paroxysmal atrial fibrillation, asymptomatic.CHADS VASC is > 2 and therefore anticoagulation is indicated although he is at high risk for bleeding complications because of difficulty with balance.  A. fib could potentially be related to stress caused by pain.  Continue aspirin, needs 30-day continuous monitor to determine if A. fib is a recurring problem.  2D Doppler echocardiogram to assess structure and function of the heart.  Final decision about long-term anticoagulation should be shared after we obtain further information from primary care with regards to safety.  If felt to be too high risk for bleeding due to balance and falls, would simply continue aspirin once daily.   Cardiology Consultation:   Patient ID: Dustin Slape Sr.; [id number]; Jan 24, 1930   Admit date: 01/18/2018 Date of Consult: 01/21/2018  Primary Care Provider: Javier Glazier, MD Primary Cardiologist: New to Dr. Tamala Julian   Patient Profile:   Dustin Popper Sr. is a 84 y.o. 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  No prior cardiac history.  He is a English as a second language teacher.  Denies history of hypertension, hyperlipidemia or diabetes.  No family history of CAD.  Remote smoking, quit when he was 84 year old.  History of Present Illness:   Mr. Balestrieri presented after  mechanical fall.  Patient has chronic balance issue due to neuropathy.  Had multiple minor fall.  Patient has lot of issue getting up and down from sitting or laying position.  He had mechanical fall in bathroom 01/18/2018 without prodromal symptoms.  Work-up revealed multiple segmental rib fracture, grade 1 liver laceration, trace right related effusion, less than 5% right pneumothorax and incidental 3.3 cm AAA.  He was admitted and treated conservatively.  He is doing well with PT.  Plan to discharge to SNF.  Around midnight he was trying to get up to use bathroom and went into A. fib rate of 110 bpm.  He was given metoprolol p.o. 25 mg once.  Review of telemetry shows he is in sinus rhythm since then however questionable control A. fib/atrial flutter. he denies any palpitation, chest pain, shortness of breath, orthopnea, PND or melena.  His only complaint is right sided rib cage pain.  Past Medical History:  Diagnosis Date  . Daily headache   . GERD (gastroesophageal reflux disease)   . History of duodenal ulcer   . History of kidney stones   . Osteoporosis   . Peripheral neuropathy   . Prostate CA ([place]) 2000s  . Varicose veins of both lower extremities   . Ventral hernia    "has had it for sometime; at least 3 yrs" (01/19/2018)    Past Surgical History:  Procedure Laterality Date  . CATARACT EXTRACTION, BILATERAL Bilateral 1980s   "iris hung implant in right eye only; implant went bad w/in 3 years"  . CORNEAL TRANSPLANT Right 1980s  . EYE SURGERY    .  FRACTURE SURGERY    . HERNIA REPAIR    . KNEE ARTHROSCOPY Right 1980s  . LAPAROSCOPIC CHOLECYSTECTOMY  ~ 1998  . PROSTATECTOMY  2003  . THYROID SURGERY  ~ 1952 & ~ 1954   "thyroid tumor"  . TRANSURETHRAL RESECTION OF PROSTATE  1990s   "several; for ringneck bladder"  . UMBILICAL HERNIA REPAIR  ~ 1998   "w/gallbladder OR"  . WRIST FRACTURE SURGERY Left 1995    Inpatient Medications: Scheduled Meds: . acetaminophen  1,000 mg Oral  Q8H  . aspirin EC  81 mg Oral Daily  . cycloSPORINE  1 drop Both Eyes BID  . DULoxetine  60 mg Oral Daily  . enoxaparin (LOVENOX) injection  40 mg Subcutaneous Q24H  . furosemide  20 mg Oral Daily  . gabapentin  300 mg Oral BID  . mouth rinse  15 mL Mouth Rinse BID  . traMADol  50 mg Oral Q6H   Continuous Infusions:  PRN Meds: HYDROmorphone (DILAUDID) injection, ondansetron **OR** ondansetron (ZOFRAN) IV, oxyCODONE  Allergies:    Allergies  Allergen Reactions  . Hydrocodone Rash    Mild rash one time, but has taken other agents with no reaction    Social History:   Social History   Socioeconomic History  . Marital status: Married    Spouse name: Not on file  . Number of children: Not on file  . Years of education: Not on file  . Highest education level: Not on file  Occupational History  . Not on file  Social Needs  . Financial resource strain: Not on file  . Food insecurity:    Worry: Not on file    Inability: Not on file  . Transportation needs:    Medical: Not on file    Non-medical: Not on file  Tobacco Use  . Smoking status: Former Smoker    Packs/day: 1.00    Years: 19.00    Pack years: 19.00    Types: Cigarettes    Last attempt to quit: 08/11/1987    Years since quitting: 30.4  . Smokeless tobacco: Never Used  Substance and Sexual Activity  . Alcohol use: Yes    Alcohol/week: 4.2 oz    Types: 7 Glasses of wine per week  . Drug use: Never  . Sexual activity: Not Currently  Lifestyle  . Physical activity:    Days per week: Not on file    Minutes per session: Not on file  . Stress: Not on file  Relationships  . Social connections:    Talks on phone: Not on file    Gets together: Not on file    Attends religious service: Not on file    Active member of club or organization: Not on file    Attends meetings of clubs or organizations: Not on file    Relationship status: Not on file  . Intimate partner violence:    Fear of current or ex partner: Not  on file    Emotionally abused: Not on file    Physically abused: Not on file    Forced sexual activity: Not on file  Other Topics Concern  . Not on file  Social History Narrative  . Not on file    Family History:   Denies family history of CAD ROS:  Please see the history of present illness.  All other ROS reviewed and negative.     Physical Exam/Data:   Vitals:   01/20/18 2207 01/21/18 0136 01/21/18 0645 01/21/18 1246  BP: (!) 128/92 117/76 102/71 114/77  Pulse: 93  76 78  Resp:   17   Temp: 97.9 F (36.6 C)  98.2 F (36.8 C) 97.7 F (36.5 C)  TempSrc: Oral  Oral Oral  SpO2: 93%  90% 95%  Weight:      Height:        Intake/Output Summary (Last 24 hours) at 01/21/2018 1321 Last data filed at 01/21/2018 0337 Gross per 24 hour  Intake -  Output 200 ml  Net -200 ml   Filed Weights   01/18/18 1540 01/18/18 2315  Weight: 240 lb (108.9 kg) 245 lb 2.4 oz (111.2 kg)   Body mass index is 33.25 kg/m.  General:  Well nourished, well developed, in no acute distress HEENT: normal Lymph: no adenopathy Neck: no JVD Endocrine:  No thryomegaly Vascular: No carotid bruits; FA pulses 2+ bilaterally without bruits  Cardiac:  normal S1, S2; RRR; no murmur Lungs: Diminished breath sounds at bases  abd: soft, tender on right chest and upper right quadrant. no hepatomegaly  Ext: no edema Musculoskeletal:  No deformities, BUE and BLE strength normal and equal Skin: warm and dry  Neuro:  CNs 2-12 intact, no focal abnormalities noted Psych:  Normal affect   EKG:  The EKG was personally reviewed and demonstrates:.  Atrial fibrillation at rate of 103 bpm Telemetry:  Telemetry was personally reviewed and demonstrates: Sinus rhythm versus controlled a flutter currently  Relevant CV Studies: As above  Laboratory Data:  Chemistry Recent Labs  Lab 01/18/18 1647 01/19/18 0519 01/20/18 0702  NA 140 139 140  K 3.9 4.3 4.0  CL 105 103 101  CO2 28 28 31   GLUCOSE 116* 139* 106*    BUN 13 8 18   CREATININE 0.94 0.80 1.17  CALCIUM 8.9 8.8* 9.3  GFRNONAA >60 >60 55*  GFRAA >60 >60 >60  ANIONGAP 7 8 8     Recent Labs  Lab 01/18/18 1647  PROT 7.0  ALBUMIN 4.0  AST 26  ALT 18  ALKPHOS 64  BILITOT 0.8   Hematology Recent Labs  Lab 01/19/18 0519 01/20/18 0702 01/21/18 0627  WBC 9.1 6.1 7.0  RBC 4.80 4.48 4.41  HGB 15.0 14.0 13.6  HCT 43.4 41.4 41.0  MCV 90.4 92.4 93.0  MCH 31.3 31.3 30.8  MCHC 34.6 33.8 33.2  RDW 12.4 12.6 12.7  PLT 190 185 192   Cardiac EnzymesNo results for input(s): TROPONINI in the last 168 hours. No results for input(s): TROPIPOC in the last 168 hours.  BNPNo results for input(s): BNP, PROBNP in the last 168 hours.  DDimer No results for input(s): DDIMER in the last 168 hours.  Radiology/Studies:  Dg Chest 2 View  Result Date: 01/20/2018 CLINICAL DATA:  Recent fall with multiple right rib fractures and pneumothorax EXAM: CHEST - 2 VIEW COMPARISON:  01/18/2018 FINDINGS: Cardiac shadow is again enlarged. Thoracic aorta is tortuous with mild atherosclerotic calcifications. Lungs are well aerated bilaterally. No focal pneumothorax is seen. Mild chronic scarring is again noted bilaterally. No sizable effusion is seen. No acute bony abnormality is noted. IMPRESSION: Chronic changes without acute abnormality. Electronically Signed   By: Inez Catalina M.D.   On: 01/20/2018 08:01   Ct Chest W Contrast  Result Date: 01/18/2018 CLINICAL DATA:  Fall with injury of the right mid back, right costovertebral region, and right flank. Tenderness. History of prostate cancer and prior prostatectomy. EXAM: CT CHEST, ABDOMEN, AND PELVIS WITH CONTRAST TECHNIQUE: Multidetector CT imaging of the chest,  abdomen and pelvis was performed following the standard protocol during bolus administration of intravenous contrast. CONTRAST:  152mL ISOVUE-300 IOPAMIDOL (ISOVUE-300) INJECTION 61% COMPARISON:  None. FINDINGS: CT CHEST FINDINGS Cardiovascular: Coronary, aortic  arch, and branch vessel atherosclerotic vascular disease. Tortuous aortic arch and branch vasculature. Mediastinum/Nodes: Right hilar lymph node 1.0 cm in short axis, image 25/2. Right infrahilar lymph node 0.9 cm in short axis, image 31/2. Left hilar lymph node 1.3 cm in short axis, image 26/2. Lungs/Pleura: There is a less than 5% right pneumothorax present anteriorly, most notable at the lung base. Trace right pleural effusion. Old granulomatous disease with calcified lymph nodes and several calcified pulmonary nodules. Mild-to-moderate peripheral interstitial accentuation in both lungs. This could reflect mild fibrosis but does not currently represent end-stage lung disease. Bulla in the superior segment left lower lobe noted. No definite pulmonary contusion. Musculoskeletal: Displaced segmental fracture right twelfth rib. Mildly displaced segmental fracture, right eleventh rib. Mildly displaced segmental fracture right tenth rib. Displaced posterolateral fracture of the right ninth rib. Nondisplaced age-indeterminate fractures of the right eighth, seventh, sixth, and fifth ribs. Thoracic spondylosis. Multilevel bridging spurring in the thoracic spine. CT ABDOMEN PELVIS FINDINGS Hepatobiliary: In the dome of the right hepatic lobe a 8 mm hypodense lesion on image 104/5 is likely a cyst and less likely to be a small laceration given the lack of immediate proximity to the adjacent rib fractures. However, there is some trace fluid along the posterior inferior margin of the right hepatic lobe, and this portion of the liver is close to some of the rib fractures. Certainly no large laceration is evident in the liver. Prior cholecystectomy. Pancreas: Unremarkable Spleen: Old granulomatous disease. Adrenals/Urinary Tract: Hypodense lesions in the left kidney are fluid density and probably cysts, although we do not have precontrast images to directly compare. Adrenal glands normal. Stomach/Bowel: Unremarkable  Vascular/Lymphatic: Aortoiliac atherosclerotic vascular disease. Atherosclerotic fusiform infrarenal abdominal aortic aneurysm 3.3 cm anterior-posterior diameter. Reproductive: Prostatectomy. Other: No supplemental non-categorized findings. Musculoskeletal: Bridging spurring of the right sacroiliac joint. Spurring of both femoral heads and acetabula. Lumbar spondylosis and degenerative disc disease potentially with mild left foraminal stenosis at the L5-S1 level. IMPRESSION: 1. Displaced and segmental fractures involving the right lower ribs, and also suspected nondisplaced fractures of the right fifth through eighth ribs. There is a less than 5% associated right pneumothorax and trace right pleural effusion. 2. There is also some trace fluid adjacent to the liver. Cyst or tiny laceration along the dome of the right hepatic lobe. I do not see any other potential laceration, but the trace fluid in the right paracolic gutter in proximity to the rib fractures could potentially indicate a subtle/minimal liver injury. 3. Infrarenal abdominal aortic aneurysm, 3.3 cm in diameter. Recommend followup by ultrasound in 3 years. This recommendation follows ACR consensus guidelines: White Paper of the ACR Incidental Findings Committee II on Vascular Findings. J Am Coll Radiol 2013; 54:270-623 4. Mild peripheral interstitial accentuation in the lungs suggesting mild fibrosis. 5. Mild bilateral hilar adenopathy, significance uncertain. This could be reactive. 6. Other imaging findings of potential clinical significance: Aortic Atherosclerosis (ICD10-I70.0). Coronary atherosclerosis. Old granulomatous disease. Hypodense left renal lesions are most likely to be cysts. Critical Value/emergent results were called by telephone at the time of interpretation on 01/18/2018 at 6:09 pm to Dr. Marda Stalker , who verbally acknowledged these results. Electronically Signed   By: Van Clines M.D.   On: 01/18/2018 18:10   Ct  Abdomen Pelvis W Contrast  Result  Date: 01/18/2018 CLINICAL DATA:  Fall with injury of the right mid back, right costovertebral region, and right flank. Tenderness. History of prostate cancer and prior prostatectomy. EXAM: CT CHEST, ABDOMEN, AND PELVIS WITH CONTRAST TECHNIQUE: Multidetector CT imaging of the chest, abdomen and pelvis was performed following the standard protocol during bolus administration of intravenous contrast. CONTRAST:  152mL ISOVUE-300 IOPAMIDOL (ISOVUE-300) INJECTION 61% COMPARISON:  None. FINDINGS: CT CHEST FINDINGS Cardiovascular: Coronary, aortic arch, and branch vessel atherosclerotic vascular disease. Tortuous aortic arch and branch vasculature. Mediastinum/Nodes: Right hilar lymph node 1.0 cm in short axis, image 25/2. Right infrahilar lymph node 0.9 cm in short axis, image 31/2. Left hilar lymph node 1.3 cm in short axis, image 26/2. Lungs/Pleura: There is a less than 5% right pneumothorax present anteriorly, most notable at the lung base. Trace right pleural effusion. Old granulomatous disease with calcified lymph nodes and several calcified pulmonary nodules. Mild-to-moderate peripheral interstitial accentuation in both lungs. This could reflect mild fibrosis but does not currently represent end-stage lung disease. Bulla in the superior segment left lower lobe noted. No definite pulmonary contusion. Musculoskeletal: Displaced segmental fracture right twelfth rib. Mildly displaced segmental fracture, right eleventh rib. Mildly displaced segmental fracture right tenth rib. Displaced posterolateral fracture of the right ninth rib. Nondisplaced age-indeterminate fractures of the right eighth, seventh, sixth, and fifth ribs. Thoracic spondylosis. Multilevel bridging spurring in the thoracic spine. CT ABDOMEN PELVIS FINDINGS Hepatobiliary: In the dome of the right hepatic lobe a 8 mm hypodense lesion on image 104/5 is likely a cyst and less likely to be a small laceration given the lack  of immediate proximity to the adjacent rib fractures. However, there is some trace fluid along the posterior inferior margin of the right hepatic lobe, and this portion of the liver is close to some of the rib fractures. Certainly no large laceration is evident in the liver. Prior cholecystectomy. Pancreas: Unremarkable Spleen: Old granulomatous disease. Adrenals/Urinary Tract: Hypodense lesions in the left kidney are fluid density and probably cysts, although we do not have precontrast images to directly compare. Adrenal glands normal. Stomach/Bowel: Unremarkable Vascular/Lymphatic: Aortoiliac atherosclerotic vascular disease. Atherosclerotic fusiform infrarenal abdominal aortic aneurysm 3.3 cm anterior-posterior diameter. Reproductive: Prostatectomy. Other: No supplemental non-categorized findings. Musculoskeletal: Bridging spurring of the right sacroiliac joint. Spurring of both femoral heads and acetabula. Lumbar spondylosis and degenerative disc disease potentially with mild left foraminal stenosis at the L5-S1 level. IMPRESSION: 1. Displaced and segmental fractures involving the right lower ribs, and also suspected nondisplaced fractures of the right fifth through eighth ribs. There is a less than 5% associated right pneumothorax and trace right pleural effusion. 2. There is also some trace fluid adjacent to the liver. Cyst or tiny laceration along the dome of the right hepatic lobe. I do not see any other potential laceration, but the trace fluid in the right paracolic gutter in proximity to the rib fractures could potentially indicate a subtle/minimal liver injury. 3. Infrarenal abdominal aortic aneurysm, 3.3 cm in diameter. Recommend followup by ultrasound in 3 years. This recommendation follows ACR consensus guidelines: White Paper of the ACR Incidental Findings Committee II on Vascular Findings. J Am Coll Radiol 2013; 62:831-517 4. Mild peripheral interstitial accentuation in the lungs suggesting mild  fibrosis. 5. Mild bilateral hilar adenopathy, significance uncertain. This could be reactive. 6. Other imaging findings of potential clinical significance: Aortic Atherosclerosis (ICD10-I70.0). Coronary atherosclerosis. Old granulomatous disease. Hypodense left renal lesions are most likely to be cysts. Critical Value/emergent results were called by telephone at the  time of interpretation on 01/18/2018 at 6:09 pm to Dr. Marda Stalker , who verbally acknowledged these results. Electronically Signed   By: Van Clines M.D.   On: 01/18/2018 18:10    Assessment and Plan:   1. New onset atrial fibrillation -Likely exacerbated by acute illness/pain.  He might be converted to sinus rhythm after metoprolol 25 mg x 1. CHADSVASC score of 2 for age only.  Has chronic balance issue and multiple small fall due to peripheral neuropathy.  Will review anticoagulation option with Dr. Tamala Julian.  Get echocardiogram.    For questions or updates, please contact [facility] Please consult [email or web address] for contact info under Cardiology/STEMI.   Jarrett Soho, [place]  01/21/2018 1:21 PM

## 2018-01-21 NOTE — Clinical Social Work Placement (Signed)
   CLINICAL SOCIAL WORK PLACEMENT  NOTE [facility]  Date:  01/21/2018  Patient Details  Name: Dustin Hines. MRN: [id number] Date of Birth: [birth year]  Clinical Social Work is seeking post-discharge placement for this patient at the [place] level of care (*CSW will initial, date and re-position this form in  chart as items are completed):      Patient/family provided with [place] Work Department's list of facilities offering this level of care within the geographic area requested by the patient (or if unable, by the patient's family).  Yes   Patient/family informed of their freedom to choose among providers that offer the needed level of care, that participate in Medicare, Medicaid or managed care program needed by the patient, have an available bed and are willing to accept the patient.      Patient/family informed of [facility]'s ownership interest in [facility] and [facility], as well as of the fact that they are under no obligation to receive care at these facilities.  PASRR submitted to EDS on       PASRR number received on 01/21/18     Existing PASRR number confirmed on       FL2 transmitted to all facilities in geographic area requested by pt/family on 01/21/18     FL2 transmitted to all facilities within larger geographic area on       Patient informed that his/her managed care company has contracts with or will negotiate with certain facilities, including the following:        Yes   Patient/family informed of bed offers received.  Patient chooses bed at [facility]     Physician recommends and patient chooses bed at      Patient to be transferred to [facility] on 01/22/18.  Patient to be transferred to facility by [facility]     Patient family notified on 01/21/18 of transfer.  Name of family member notified:  wife, Pleas Koch at bedside     PHYSICIAN Please prepare priority  discharge summary, including medications, Please prepare prescriptions     Additional Comment:    _______________________________________________ Alexander Mt, LCSWA 01/21/2018, 5:02 PM

## 2018-01-21 NOTE — Progress Notes (Signed)
CC:  FAll  Subjective: He looks good and is moving about 1250 on the IS.  He had allot of issues getting up and using the urinal and that's his explanation for his AF.  Sore Right side chest and RUQ, but it bothers him most when moving.   He has no hx of know AF.  He has some baseline balance issues that probably caused his initial fall.  Objective: Vital signs in last 24 hours: Temp:  [97.9 F (36.6 C)-98.5 F (36.9 C)] 98.2 F (36.8 C) (06/14 0645) Pulse Rate:  [76-93] 76 (06/14 0645) Resp:  [17] 17 (06/14 0645) BP: (102-128)/(71-92) 102/71 (06/14 0645) SpO2:  [90 %-93 %] 90 % (06/14 0645) Last BM Date: 01/17/18 PO 240 recorded 200 urine Afebrile, Telem showed AF last PM, received Lopressor and appears to have converted back today. H/H down slightly but stable  Intake/Output from previous day: 06/13 0701 - 06/14 0700 In: 240 [P.O.:240] Out: 200 [Urine:200] Intake/Output this shift: No intake/output data recorded.  General appearance: alert, cooperative and no distress Resp: clear to auscultation bilaterally and still down some in the bases. Chest wall: no tenderness, right sided chest wall tenderness Cardio: He had some AF last PM, back in SR this AM, not much in the way of strips to compare. GI: soft, a little tender over RUQ and Right chest.  Lab Results:  Recent Labs    01/20/18 0702 01/21/18 0627  WBC 6.1 7.0  HGB 14.0 13.6  HCT 41.4 41.0  PLT 185 192    BMET Recent Labs    01/19/18 0519 01/20/18 0702  NA 139 140  K 4.3 4.0  CL 103 101  CO2 28 31  GLUCOSE 139* 106*  BUN 8 18  CREATININE 0.80 1.17  CALCIUM 8.8* 9.3   PT/INR No results for input(s): LABPROT, INR in the last 72 hours.  Recent Labs  Lab 01/18/18 1647  AST 26  ALT 18  ALKPHOS 64  BILITOT 0.8  PROT 7.0  ALBUMIN 4.0     Lipase  No results found for: LIPASE   Medications: . acetaminophen  1,000 mg Oral Q8H  . aspirin EC  81 mg Oral Daily  . cycloSPORINE  1 drop Both  Eyes BID  . DULoxetine  60 mg Oral Daily  . enoxaparin (LOVENOX) injection  40 mg Subcutaneous Q24H  . furosemide  20 mg Oral Daily  . gabapentin  300 mg Oral BID  . mouth rinse  15 mL Mouth Rinse BID  . traMADol  50 mg Oral Q6H    Assessment/Plan Hx of peripheral neuropathy Hx prostate cancer Body mass index is 33.25    Fall Displaced right rib fractures 5-9 Trace right pleural effusion/ <5% right pneumothorax Possible Grade I liver laceration - Pt can be anticoagulated/Dr. Hulen Skains New onset AF - cardiology consult pending   [id number] diet ID: None DVT: Lovenox Follow up:  TBD  Plan:  From our standpoint he can go home.  With new AF he will be seen by Cardiology today.  He has had 25 mg of lopressor and converted.  With his balance issues we will need to be careful with his BP.  He has 2 weeks of SNF care at his residence so he does not have to qualify with the normal insurance criteria.  Case managers and Social services are working on this.  We will decide on discharge after all of this is sorted out.  LOS: 3 days    Levorn Oleski 01/21/2018 [phone]

## 2018-01-21 NOTE — Clinical Social Work Note (Signed)
Clinical Social Work Assessment  Patient Details  Name: Dustin Debow Sr. MRN: [id number] Date of Birth: 1930/03/05  Date of referral:  01/21/18               Reason for consult:  Facility Placement, Discharge Planning                Permission sought to share information with:  Customer service manager, Psychiatrist Permission granted to share information::  Yes, Verbal Permission Granted  Name::     Reginia Naas  Agency::  [facility]  Relationship::  wife  Contact Information:  [phone]  Housing/Transportation Living arrangements for the past 2 months:  [place] of Information:  Patient, Spouse Patient Interpreter Needed:  None Criminal Activity/Legal Involvement Pertinent to Current Situation/Hospitalization:  No - Comment as needed Significant Relationships:  Adult Children, Community Support, Friend, Spouse Lives with:  Spouse, Facility Resident Do you feel safe going back to the place where you live?  Yes Need for family participation in patient care:  Yes (Comment)  Care giving concerns:  Pt had recent fall, pt is taller and larger built man who lives with a wife that is unable to provide physical assist for him in case he falls again. They would like short term SNF stay before transferring back home to [place] at [facility].    Social Worker assessment / plan:  CSW met with pt at bedside, pt wife also present. Both pt and pt wife were very positive and optimistic about pt progress to go to SNF and then return home to [place] at [facility]. Pt and pt wife state that they are eager to get home, but understand that pt will have a cardiology work up before discharge. Pt and pt wife have been living at [facility] since moving from [place] to be closer to their daughter that lives in [place]. 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 Pt and pt wife both enjoy  [place]. Pt took a fall and hit both his back and ribs. Pt states that this surprised him but he does not feel unsafe going back home when he can.   Pt wife and pt requested CSW f/u with report for missing glasses that they made. 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 When CSW called they did not have any glasses but would notify pt if they found them.   CSW has submitted clinicals to [facility], they are under review but pt will be able to discharge to [facility] when cardiology signs off.   Employment status:  Retired Nurse, adult PT Recommendations:  [place], [place] / Referral to community resources:  [place]  Patient/Family's Response to care:  Pt and pt wife both pleasant and open to chatting with CSW about disposition. They are accepting of SNF even if the stay is brief.   Patient/Family's Understanding of and Emotional Response to Diagnosis, Current Treatment, and Prognosis:  Pt and pt wife both state understanding of diagnosis, current treatment and prognosis. Pt and pt wife are both positive and optimistic about pt stay and future improvement. Pt and pt wife express realistic goals and are understanding of the limitations that factors like pt's age may play in the rehab and healing process.  Emotional Assessment Appearance:  Appears stated age Attitude/Demeanor/Rapport:  Charismatic, Engaged, Gracious Affect (typically observed):  Adaptable, Accepting, Pleasant Orientation:  Oriented to Self, Oriented to Place, Oriented to  Time, Oriented to Situation Alcohol / Substance use:  Alcohol Use(wine weekly ) Psych involvement (Current and /or in the community):  No (Comment)  Discharge Needs  Concerns to be addressed:  Care Coordination, Discharge Planning Concerns Readmission within the  last 30 days:  No Current discharge risk:  Physical Impairment Barriers to Discharge:  Ship broker, Continued Medical Work up   [employer], [place] 01/21/2018, 4:42 PM

## 2018-01-21 NOTE — Progress Notes (Signed)
Physical Therapy Treatment Patient Details Name: Dustin Pineiro Sr. MRN: [id number] DOB: July 17, 1930 Today's Date: 01/21/2018    History of Present Illness This is an 84 yo male who fell in the bathroom at home this afternoon, landing on the right side of his chest.  He was brought to the ED at [facility] where he underwent work-up which showed multiple segmental rib fractures and a Grade I liver laceration, transferred to Trauma service at [facility] for admission; PMH:  peripheral neuropathy, remote compound fx L forearm d/t mechanical fall    PT Comments    Pt performed gait and functional mobility with minimal assistance.  Pt at this time is not mobilizing well enough to return home and will require rehab in a post acute setting before returning home.  Will inform supervising PT at this time.     Follow Up Recommendations  SNF;Supervision/Assistance - 24 hour(Pt does not have adequate support to return home at this time.  )     Equipment Recommendations       Recommendations for Other Services       Precautions / Restrictions Precautions Precautions: Fall Restrictions Weight Bearing Restrictions: No    Mobility  Bed Mobility               General bed mobility comments: Pt in recliner on arrival.    Transfers Overall transfer level: Needs assistance Equipment used: Rolling walker (2 wheeled) Transfers: Sit to/from Stand Sit to Stand: Min assist         General transfer comment: Cues for hand placement with increased time due to pain.  Pt reaching for RW on arrival.    Ambulation/Gait Ambulation/Gait assistance: Min assist Gait Distance (Feet): 100 Feet(x2) Assistive device: Rolling walker (2 wheeled) Gait Pattern/deviations: Step-through pattern;Decreased stride length;Drifts right/left;Wide base of support     General Gait Details: cues for RW position especially with turns, assist intermittently d/t posterior lean, balance improved with distance; SpO2= 88-93% on RA  throughout session (nsg made aware)  X1 LOB with L turn.     Stairs             Wheelchair Mobility    Modified Rankin (Stroke Patients Only)       Balance Overall balance assessment: History of Falls                                          Cognition Arousal/Alertness: Awake/alert Behavior During Therapy: WFL for tasks assessed/performed   Area of Impairment: Memory;Safety/judgement;Following commands;Attention;Problem solving                   Current Attention Level: Selective Memory: Decreased short-term memory Following Commands: Follows one step commands with increased time(with repetition) Safety/Judgement: Decreased awareness of safety   Problem Solving: Difficulty sequencing;Requires verbal cues;Requires tactile cues General Comments: pt very inefficient with movement, forgets where he is walking, forgets what rep he is on.        Exercises Total Joint Exercises Marching in Standing: AROM;Both;10 reps;Standing General Exercises - Lower Extremity Long Arc Quad: AROM;10 reps;Both;Seated Hip Flexion/Marching: AROM;10 reps;Both;Seated Heel Raises: AROM;10 reps;Standing;Both Mini-Sqauts: AROM;Both;10 reps;Standing    General Comments        Pertinent Vitals/Pain Pain Assessment: 0-10 Pain Score: 2  Faces Pain Scale: Hurts little more Pain Location: R side with coughing Pain Descriptors / Indicators: Sore Pain Intervention(s): Monitored during session;Repositioned  Home Living                      Prior Function            PT Goals (current goals can now be found in the care plan section) Acute Rehab PT Goals Patient Stated Goal: rehab before home Potential to Achieve Goals: Good Progress towards PT goals: Progressing toward goals    Frequency    Min 3X/week      PT Plan Current plan remains appropriate    Co-evaluation              AM-PAC PT "6 Clicks" Daily Activity  Outcome Measure   Difficulty turning over in bed (including adjusting bedclothes, sheets and blankets)?: Unable Difficulty moving from lying on back to sitting on the side of the bed? : Unable Difficulty sitting down on and standing up from a chair with arms (e.g., wheelchair, bedside commode, etc,.)?: Unable Help needed moving to and from a bed to chair (including a wheelchair)?: A Little Help needed walking in hospital room?: A Little Help needed climbing 3-5 steps with a railing? : A Little 6 Click Score: 12    End of Session Equipment Utilized During Treatment: Gait belt Activity Tolerance: Patient tolerated treatment well Patient left: in chair;with call bell/phone within reach Nurse Communication: Mobility status PT Visit Diagnosis: Unsteadiness on feet (R26.81)     Time: 9326-7124 PT Time Calculation (min) (ACUTE ONLY): 34 min  Charges:  $Gait Training: 8-22 mins $Therapeutic Exercise: 8-22 mins                    G Codes:       Governor Rooks, PTA pager [place] 01/21/2018, 4:20 PM

## 2018-01-21 NOTE — Evaluation (Signed)
Occupational Therapy Evaluation Patient Details Name: Dustin Hines. MRN: [id number] DOB: 1929-10-18 Today's Date: 01/21/2018    History of Present Illness This is an 84 yo male who fell in the bathroom at home this afternoon, landing on the right side of his chest.  He was brought to the ED at [facility] where he underwent work-up which showed multiple segmental rib fractures and a Grade I liver laceration, transferred to Trauma service at [facility] for admission; PMH:  peripheral neuropathy, remote compound fx L forearm d/t mechanical fall   Clinical Impression   Pt was ambulating with a cane and able to perform self care independently. Pt presents with pain as a result of his fall, impaired balance, generalized weakness and impaired cognition. Agree with pt's plan to go to an area of his [place] in which he can get assistance for mobility and ADL and further therapy as his wife is also elderly. Will follow acutely.    Follow Up Recommendations  Home health OT;Supervision/Assistance - 24 hour(pt plans to go to ALF at [facility])    Equipment Recommendations       Recommendations for Other Services       Precautions / Restrictions Precautions Precautions: Fall Restrictions Weight Bearing Restrictions: No      Mobility Bed Mobility Overal bed mobility: Needs Assistance Bed Mobility: Supine to Sit     Supine to sit: Mod assist     General bed mobility comments: mod assist to raise trunk  Transfers Overall transfer level: Needs assistance Equipment used: Rolling walker (2 wheeled) Transfers: Sit to/from Stand Sit to Stand: Min assist;Min guard         General transfer comment: min assist for balance as he reached for walker, cues for hand placement, increased time    Balance Overall balance assessment: History of Falls                                         ADL either performed or assessed with clinical judgement   ADL Overall ADL's : Needs  assistance/impaired Eating/Feeding: Independent;Sitting   Grooming: Wash/dry hands;Wash/dry face;Brushing hair;Sitting;Set up   Upper Body Bathing: Minimal assistance;Sitting Upper Body Bathing Details (indicate cue type and reason): assist for back Lower Body Bathing: Moderate assistance;Sit to/from stand   Upper Body Dressing : Set up;Sitting   Lower Body Dressing: Moderate assistance;Sit to/from stand   Toilet Transfer: Minimal assistance;Ambulation;RW   Toileting- Clothing Manipulation and Hygiene: Moderate assistance;Sit to/from stand Toileting - Clothing Manipulation Details (indicate cue type and reason): pt managed pericare, assist to pull up underwear     Functional mobility during ADLs: Minimal assistance;Rolling walker       Vision Patient Visual Report: No change from baseline Additional Comments: blind in R eye, blurry in L     Perception     Praxis      Pertinent Vitals/Pain Pain Assessment: Faces Faces Pain Scale: Hurts little more Pain Location: R side with coughing Pain Descriptors / Indicators: Sore Pain Intervention(s): Monitored during session;Repositioned     Hand Dominance Right   Extremity/Trunk Assessment Upper Extremity Assessment Upper Extremity Assessment: Overall WFL for tasks assessed   Lower Extremity Assessment Lower Extremity Assessment: Defer to PT evaluation       Communication Communication Communication: HOH   Cognition Arousal/Alertness: Awake/alert Behavior During Therapy: WFL for tasks assessed/performed Overall Cognitive Status: No family/caregiver present to determine baseline cognitive  functioning Area of Impairment: Memory;Safety/judgement;Following commands;Attention;Problem solving                   Current Attention Level: Selective Memory: Decreased short-term memory Following Commands: Follows one step commands with increased time(with repetition) Safety/Judgement: Decreased awareness of safety    Problem Solving: Difficulty sequencing;Requires verbal cues;Requires tactile cues General Comments: pt very inefficient with movement, forgets where he is walking   General Comments       Exercises     Shoulder Instructions      Home Living Family/patient expects to be discharged to:: Private residence Living Arrangements: Spouse/significant other Available Help at Discharge: Family;Available 24 hours/day Type of Home: Independent living facility Home Access: Level entry     Home Layout: One level     Bathroom Shower/Tub: Occupational psychologist: Handicapped height     Home Equipment: [place] - single point          Prior Functioning/Environment Level of Independence: Independent;Independent with assistive device(s)        Comments: amb with cane        OT Problem List: Decreased strength;Decreased activity tolerance;Impaired balance (sitting and/or standing);Impaired vision/perception;Decreased cognition;Decreased safety awareness;Decreased knowledge of use of DME or AE;Pain      OT Treatment/Interventions: Self-care/ADL training;DME and/or AE instruction;Patient/family education;Balance training;Therapeutic activities    OT Goals(Current goals can be found in the care plan section) Acute Rehab OT Goals Patient Stated Goal: home soon OT Goal Formulation: With patient Time For Goal Achievement: 02/04/18 Potential to Achieve Goals: Good ADL Goals Pt Will Perform Grooming: with supervision;standing Pt Will Perform Lower Body Bathing: with supervision;sit to/from stand Pt Will Perform Lower Body Dressing: with supervision;sit to/from stand Pt Will Transfer to Toilet: with supervision;ambulating Pt Will Perform Toileting - Clothing Manipulation and hygiene: with supervision;sit to/from stand Additional ADL Goal #1: Pt will perform bed mobility with supervision from flat bed.  OT Frequency: Min 2X/week   Barriers to D/C:            Co-evaluation               AM-PAC PT "6 Clicks" Daily Activity     Outcome Measure Help from another person eating meals?: None Help from another person taking care of personal grooming?: A Little Help from another person toileting, which includes using toliet, bedpan, or urinal?: A Lot Help from another person bathing (including washing, rinsing, drying)?: A Lot Help from another person to put on and taking off regular upper body clothing?: A Little Help from another person to put on and taking off regular lower body clothing?: A Lot 6 Click Score: 16   End of Session Equipment Utilized During Treatment: Gait belt;Rolling walker Nurse Communication: Other (comment)(pt is bathed)  Activity Tolerance: Patient tolerated treatment well Patient left: in chair;with call bell/phone within reach;with nursing/sitter in room  OT Visit Diagnosis: Unsteadiness on feet (R26.81);Other abnormalities of gait and mobility (R26.89);Muscle weakness (generalized) (M62.81);Pain;Low vision, both eyes (H54.2);History of falling (Z91.81)                Time: 7654-6503 OT Time Calculation (min): 52 min Charges:  OT General Charges $OT Visit: 1 Visit OT Evaluation $OT Eval Moderate Complexity: 1 Mod OT Treatments $Self Care/Home Management : 23-37 mins G-Codes:     02-18-18 Nestor Lewandowsky, OTR/L Pager: [phone]  Werner Lean, [place] Feb 18, 2018, 10:33 AM

## 2018-01-22 ENCOUNTER — Inpatient Hospital Stay (HOSPITAL_COMMUNITY): Payer: Medicare HMO

## 2018-01-22 DIAGNOSIS — I5030 Unspecified diastolic (congestive) heart failure: Secondary | ICD-10-CM

## 2018-01-22 DIAGNOSIS — I4891 Unspecified atrial fibrillation: Secondary | ICD-10-CM

## 2018-01-22 LAB — ECHOCARDIOGRAM COMPLETE
HEIGHTINCHES: 72 in
Weight: 3922.42 oz

## 2018-01-22 MED ORDER — ACETAMINOPHEN 500 MG PO TABS: 1000.0000 mg | ORAL_TABLET | Freq: Three times a day (TID) | ORAL | 0 refills | Status: DC | PRN

## 2018-01-22 NOTE — Progress Notes (Signed)
Progress Note   Subjective   Doing well today, the patient denies CP or SOB.  No new concerns.  Wants to go home.  Inpatient Medications    Scheduled Meds: . acetaminophen  1,000 mg Oral Q8H  . aspirin EC  81 mg Oral Daily  . cycloSPORINE  1 drop Both Eyes BID  . DULoxetine  60 mg Oral Daily  . enoxaparin (LOVENOX) injection  40 mg Subcutaneous Q24H  . furosemide  20 mg Oral Daily  . gabapentin  300 mg Oral BID  . mouth rinse  15 mL Mouth Rinse BID  . traMADol  50 mg Oral Q6H   Continuous Infusions:  PRN Meds: HYDROmorphone (DILAUDID) injection, ondansetron **OR** ondansetron (ZOFRAN) IV, oxyCODONE   Vital Signs    Vitals:   01/21/18 0645 01/21/18 1246 01/21/18 2100 01/22/18 0447  BP: 102/71 114/77 139/82 (!) 164/95  Pulse: 76 78 79 86  Resp: 17  17 17   Temp: 98.2 F (36.8 C) 97.7 F (36.5 C) 97.9 F (36.6 C) 97.8 F (36.6 C)  TempSrc: Oral Oral Oral Oral  SpO2: 90% 95% 94% 94%  Weight:      Height:        Intake/Output Summary (Last 24 hours) at 01/22/2018 1154 Last data filed at 01/22/2018 0618 Gross per 24 hour  Intake 360 ml  Output 300 ml  Net 60 ml   Filed Weights   01/18/18 1540 01/18/18 2315  Weight: 240 lb (108.9 kg) 245 lb 2.4 oz (111.2 kg)    Telemetry    Sinus rhythm - Personally Reviewed  Physical Exam   GEN- The patient is elderly appearing, alert and oriented x 3 today.   Head- normocephalic, atraumatic Eyes-  Sclera clear, conjunctiva pink Ears- hearing intact Oropharynx- clear Neck- supple, Lungs- Clear to ausculation bilaterally, normal work of breathing Heart- Regular rate and rhythm  GI- soft, NT, ND, + BS Extremities- no clubbing, cyanosis, or edema  MS- no significant deformity or atrophy   Labs    Chemistry Recent Labs  Lab 01/18/18 1647 01/19/18 0519 01/20/18 0702  NA 140 139 140  K 3.9 4.3 4.0  CL 105 103 101  CO2 28 28 31   GLUCOSE 116* 139* 106*  BUN 13 8 18   CREATININE 0.94 0.80 1.17  CALCIUM 8.9  8.8* 9.3  PROT 7.0  --   --   ALBUMIN 4.0  --   --   AST 26  --   --   ALT 18  --   --   ALKPHOS 64  --   --   BILITOT 0.8  --   --   GFRNONAA >60 >60 55*  GFRAA >60 >60 >60  ANIONGAP 7 8 8      Hematology Recent Labs  Lab 01/19/18 0519 01/20/18 0702 01/21/18 0627  WBC 9.1 6.1 7.0  RBC 4.80 4.48 4.41  HGB 15.0 14.0 13.6  HCT 43.4 41.4 41.0  MCV 90.4 92.4 93.0  MCH 31.3 31.3 30.8  MCHC 34.6 33.8 33.2  RDW 12.4 12.6 12.7  PLT 190 185 192    Cardiac EnzymesNo results for input(s): TROPONINI in the last 168 hours. No results for input(s): TROPIPOC in the last 168 hours.      Assessment & Plan    1.  New onset afib Occurred in the setting of acute medical illness/ pain.  No prior symptomatic events.  Asymptomatic with this episode also. chads2vasc score is relatively low (2) and risks of falls with  neuropathy is quite high.  I would therefore not advise anticoagulation at this time.  Echo is pending.  If low risk, ok to discharge with follow-up with primary care.  I am not sure that wearing an event monitor would change our plans in the short term.    I will arrange follow-up in AF clinic in 2 weeks.  If echo ok, OK to discharge from cardiology standpoint.  We will see as needed while here.  Thompson Grayer MD, [facility] 01/22/2018 11:54 AM

## 2018-01-22 NOTE — Progress Notes (Signed)
  Echocardiogram 2D Echocardiogram has been performed.  Dustin Hines 01/22/2018, 11:01 AM

## 2018-01-22 NOTE — Clinical Social Work Placement (Signed)
   CLINICAL SOCIAL WORK PLACEMENT  NOTE  Date:  01/22/2018  Patient Details  Name: Dustin Abebe Sr. MRN: [id number] Date of Birth: Feb 17, 1930  Clinical Social Work is seeking post-discharge placement for this patient at the [place] level of care (*CSW will initial, date and re-position this form in  chart as items are completed):      Patient/family provided with [place] Work Department's list of facilities offering this level of care within the geographic area requested by the patient (or if unable, by the patient's family).  Yes   Patient/family informed of their freedom to choose among providers that offer the needed level of care, that participate in Medicare, Medicaid or managed care program needed by the patient, have an available bed and are willing to accept the patient.      Patient/family informed of 's ownership interest in [facility] and [facility], as well as of the fact that they are under no obligation to receive care at these facilities.  PASRR submitted to EDS on       PASRR number received on 01/21/18     Existing PASRR number confirmed on       FL2 transmitted to all facilities in geographic area requested by pt/family on 01/21/18     FL2 transmitted to all facilities within larger geographic area on       Patient informed that his/her managed care company has contracts with or will negotiate with certain facilities, including the following:        Yes   Patient/family informed of bed offers received.  Patient chooses bed at [facility]     Physician recommends and patient chooses bed at      Patient to be transferred to [facility] [place] on 01/22/18.  Patient to be transferred to facility by [facility]     Patient family notified on 01/22/18 of transfer.  Name of family member notified:  wife, Pleas Koch at bedside     PHYSICIAN Please prepare priority discharge summary,  including medications, Please prepare prescriptions     Additional Comment:   Barbette Or, [place]

## 2018-01-22 NOTE — Clinical Social Work Note (Signed)
Clinical Social Worker facilitated patient discharge including contacting patient family and facility to confirm patient discharge plans.  Clinical information faxed to facility and family agreeable with plan.  CSW arranged transport via family to [place] [facility].  RN to call report prior to discharge.  Clinical Social Worker will sign off for now as social work intervention is no longer needed. Please consult [place] again if new need arises.  Barbette Or, [place]

## 2018-01-22 NOTE — Progress Notes (Deleted)
Dr Darliss Ridgel consult note reviewed. Tele shows SR, PACs. Has not had any episodes of RVR even when in afib. F/u echo today, arrange outpatient 30 day event monitor and outpatient followup. Holding on anticoag as we look to establish afib outside this acute presentation and also way risks given his fall risks, this can all be done as outpatient.  F/u echo, if no major findings we will plan on completing his workup as outpatient.    Carlyle Dolly MD

## 2018-01-22 NOTE — Progress Notes (Signed)
Discharged to [employer] [facility] home today. Discharged instructions,personal belongings given to patient and wife. Report given to Wynn Banker, CN at [facility].

## 2018-01-22 NOTE — Progress Notes (Signed)
Patient ID: Dustin Popper Sr., male   DOB: [birth year], 84 y.o.   MRN: [id number]     Subjective: Patient is up out of bed.  Denies shortness of breath.  Has some manageable pain lower right chest only with deep inspiration.  No other complaints.  Objective: Vital signs in last 24 hours: Temp:  [97.7 F (36.5 C)-97.9 F (36.6 C)] 97.8 F (36.6 C) (06/15 0447) Pulse Rate:  [78-86] 86 (06/15 0447) Resp:  [17] 17 (06/15 0447) BP: (114-164)/(77-95) 164/95 (06/15 0447) SpO2:  [94 %-95 %] 94 % (06/15 0447) Last BM Date: 01/18/18  Intake/Output from previous day: 06/14 0701 - 06/15 0700 In: 360 [P.O.:360] Out: 300 [Urine:300] Intake/Output this shift: No intake/output data recorded.  General appearance: alert, cooperative and no distress Resp: clear to auscultation bilaterally and No increased work of breathing Cardio: regular rate and rhythm GI: normal findings: soft, non-tender  Lab Results:  Recent Labs    01/20/18 0702 01/21/18 0627  WBC 6.1 7.0  HGB 14.0 13.6  HCT 41.4 41.0  PLT 185 192   BMET Recent Labs    01/20/18 0702  NA 140  K 4.0  CL 101  CO2 31  GLUCOSE 106*  BUN 18  CREATININE 1.17  CALCIUM 9.3     Studies/Results: Follow-up chest x-ray 6/13 showed no acute abnormality  Anti-infectives: Anti-infectives (From admission, onward)   None      Assessment/Plan: Fall Displaced right rib fractures 5-9 Trace right pleural effusion/ <5% right pneumothorax Grade I liver laceration Stable/improved and okay for discharge to skilled facility today   New onset of atrial fibrillation  Recommendations from cardiology-Continue aspirin, needs 30-day continuous monitor to determine if A. fib is a recurring problem.  2D Doppler echocardiogram to assess structure and function of the heart.  Final decision about long-term anticoagulation should be shared after we obtain further information from primary care with regards to safety.  If felt to be too high risk  for bleeding due to balance and falls, would simply continue aspirin once daily.         LOS: 4 days    Edward Jolly 01/22/2018

## 2018-02-04 ENCOUNTER — Encounter (HOSPITAL_COMMUNITY): Payer: Self-pay | Admitting: *Deleted

## 2018-02-04 ENCOUNTER — Telehealth (HOSPITAL_COMMUNITY): Payer: Self-pay | Admitting: *Deleted

## 2018-02-04 NOTE — Telephone Encounter (Signed)
Unable to reach by phone. Letter sent.

## 2018-02-04 NOTE — Telephone Encounter (Signed)
-----   Message from Thompson Grayer, MD sent at 01/22/2018 11:57 AM EDT ----- Please schedule follow-up with Butch Penny in 2 weeks.

## 2019-08-27 ENCOUNTER — Inpatient Hospital Stay (HOSPITAL_BASED_OUTPATIENT_CLINIC_OR_DEPARTMENT_OTHER)
Admission: EM | Admit: 2019-08-27 | Discharge: 2019-09-04 | DRG: 177 | Disposition: A | Discharge status: Skilled Nursing Facility | Payer: Medicare HMO | Source: Skilled Nursing Facility | Attending: Internal Medicine | Admitting: Internal Medicine

## 2019-08-27 ENCOUNTER — Other Ambulatory Visit: Payer: Self-pay

## 2019-08-27 ENCOUNTER — Emergency Department (HOSPITAL_BASED_OUTPATIENT_CLINIC_OR_DEPARTMENT_OTHER): Payer: Medicare HMO

## 2019-08-27 ENCOUNTER — Encounter (HOSPITAL_BASED_OUTPATIENT_CLINIC_OR_DEPARTMENT_OTHER): Payer: Self-pay | Admitting: Emergency Medicine

## 2019-08-27 DIAGNOSIS — U071 COVID-19: Secondary | ICD-10-CM | POA: Diagnosis present

## 2019-08-27 DIAGNOSIS — J1282 Pneumonia due to coronavirus disease 2019: Secondary | ICD-10-CM | POA: Diagnosis present

## 2019-08-27 DIAGNOSIS — G9341 Metabolic encephalopathy: Secondary | ICD-10-CM | POA: Diagnosis present

## 2019-08-27 DIAGNOSIS — W19XXXA Unspecified fall, initial encounter: Secondary | ICD-10-CM

## 2019-08-27 DIAGNOSIS — J9600 Acute respiratory failure, unspecified whether with hypoxia or hypercapnia: Secondary | ICD-10-CM

## 2019-08-27 DIAGNOSIS — I82412 Acute embolism and thrombosis of left femoral vein: Secondary | ICD-10-CM | POA: Diagnosis present

## 2019-08-27 DIAGNOSIS — R296 Repeated falls: Secondary | ICD-10-CM | POA: Diagnosis present

## 2019-08-27 DIAGNOSIS — I2699 Other pulmonary embolism without acute cor pulmonale: Secondary | ICD-10-CM | POA: Diagnosis present

## 2019-08-27 DIAGNOSIS — Z9079 Acquired absence of other genital organ(s): Secondary | ICD-10-CM

## 2019-08-27 DIAGNOSIS — I10 Essential (primary) hypertension: Secondary | ICD-10-CM | POA: Diagnosis present

## 2019-08-27 DIAGNOSIS — M810 Age-related osteoporosis without current pathological fracture: Secondary | ICD-10-CM | POA: Diagnosis present

## 2019-08-27 DIAGNOSIS — R509 Fever, unspecified: Secondary | ICD-10-CM

## 2019-08-27 DIAGNOSIS — K219 Gastro-esophageal reflux disease without esophagitis: Secondary | ICD-10-CM | POA: Diagnosis present

## 2019-08-27 DIAGNOSIS — J159 Unspecified bacterial pneumonia: Secondary | ICD-10-CM | POA: Diagnosis present

## 2019-08-27 DIAGNOSIS — Z87891 Personal history of nicotine dependence: Secondary | ICD-10-CM | POA: Diagnosis not present

## 2019-08-27 DIAGNOSIS — G629 Polyneuropathy, unspecified: Secondary | ICD-10-CM | POA: Diagnosis present

## 2019-08-27 DIAGNOSIS — I82432 Acute embolism and thrombosis of left popliteal vein: Secondary | ICD-10-CM | POA: Diagnosis present

## 2019-08-27 DIAGNOSIS — Z9049 Acquired absence of other specified parts of digestive tract: Secondary | ICD-10-CM | POA: Diagnosis not present

## 2019-08-27 DIAGNOSIS — J189 Pneumonia, unspecified organism: Secondary | ICD-10-CM | POA: Diagnosis not present

## 2019-08-27 DIAGNOSIS — E559 Vitamin D deficiency, unspecified: Secondary | ICD-10-CM | POA: Diagnosis present

## 2019-08-27 DIAGNOSIS — R0602 Shortness of breath: Secondary | ICD-10-CM | POA: Diagnosis present

## 2019-08-27 DIAGNOSIS — I2694 Multiple subsegmental pulmonary emboli without acute cor pulmonale: Secondary | ICD-10-CM | POA: Diagnosis not present

## 2019-08-27 DIAGNOSIS — Z8546 Personal history of malignant neoplasm of prostate: Secondary | ICD-10-CM

## 2019-08-27 DIAGNOSIS — I2602 Saddle embolus of pulmonary artery with acute cor pulmonale: Secondary | ICD-10-CM | POA: Diagnosis not present

## 2019-08-27 DIAGNOSIS — J9601 Acute respiratory failure with hypoxia: Secondary | ICD-10-CM | POA: Diagnosis present

## 2019-08-27 DIAGNOSIS — Y92009 Unspecified place in unspecified non-institutional (private) residence as the place of occurrence of the external cause: Secondary | ICD-10-CM | POA: Diagnosis not present

## 2019-08-27 DIAGNOSIS — Z79899 Other long term (current) drug therapy: Secondary | ICD-10-CM | POA: Diagnosis not present

## 2019-08-27 LAB — CBC WITH DIFFERENTIAL/PLATELET
Abs Immature Granulocytes: 0.14 10*3/uL — ABNORMAL HIGH (ref 0.00–0.07)
Basophils Absolute: 0 10*3/uL (ref 0.0–0.1)
Basophils Relative: 1 %
Eosinophils Absolute: 0 10*3/uL (ref 0.0–0.5)
Eosinophils Relative: 0 %
HCT: 42.8 % (ref 39.0–52.0)
Hemoglobin: 14.4 g/dL (ref 13.0–17.0)
Immature Granulocytes: 2 %
Lymphocytes Relative: 11 %
Lymphs Abs: 0.7 10*3/uL (ref 0.7–4.0)
MCH: 30.6 pg (ref 26.0–34.0)
MCHC: 33.6 g/dL (ref 30.0–36.0)
MCV: 91.1 fL (ref 80.0–100.0)
Monocytes Absolute: 0.8 10*3/uL (ref 0.1–1.0)
Monocytes Relative: 12 %
Neutro Abs: 4.8 10*3/uL (ref 1.7–7.7)
Neutrophils Relative %: 74 %
Platelets: 159 10*3/uL (ref 150–400)
RBC: 4.7 MIL/uL (ref 4.22–5.81)
RDW: 13.5 % (ref 11.5–15.5)
WBC: 6.5 10*3/uL (ref 4.0–10.5)
nRBC: 0 % (ref 0.0–0.2)

## 2019-08-27 LAB — BASIC METABOLIC PANEL
Anion gap: 8 (ref 5–15)
BUN: 16 mg/dL (ref 8–23)
CO2: 26 mmol/L (ref 22–32)
Calcium: 8.7 mg/dL — ABNORMAL LOW (ref 8.9–10.3)
Chloride: 106 mmol/L (ref 98–111)
Creatinine, Ser: 0.97 mg/dL (ref 0.61–1.24)
GFR calc Af Amer: >60 mL/min (ref >60)
GFR calc non Af Amer: >60 mL/min (ref >60)
Glucose, Bld: 126 mg/dL — ABNORMAL HIGH (ref 70–99)
Potassium: 3.9 mmol/L (ref 3.5–5.1)
Sodium: 140 mmol/L (ref 135–145)

## 2019-08-27 LAB — SARS CORONAVIRUS 2 (TAT 6-24 HRS): SARS Coronavirus 2: NEGATIVE

## 2019-08-27 LAB — TROPONIN I (HIGH SENSITIVITY): Troponin I (High Sensitivity): 7 ng/L (ref <18)

## 2019-08-27 LAB — BRAIN NATRIURETIC PEPTIDE: B Natriuretic Peptide: 61.2 pg/mL (ref 0.0–100.0)

## 2019-08-27 LAB — HEPARIN LEVEL (UNFRACTIONATED): Heparin Unfractionated: 0.71 IU/mL — ABNORMAL HIGH (ref 0.30–0.70)

## 2019-08-27 LAB — SARS CORONAVIRUS 2 AG (30 MIN TAT): SARS Coronavirus 2 Ag: NEGATIVE

## 2019-08-27 MED ORDER — HEPARIN (PORCINE) 25000 UT/250ML-% IV SOLN
1500.0000 [IU]/h | INTRAVENOUS | Status: DC
  Administered 2019-08-27: 1500 [IU]/h via INTRAVENOUS
  Administered 2019-08-27: 08:00:00 1600 [IU]/h via INTRAVENOUS
  Filled 2019-08-27 (×2): qty 250

## 2019-08-27 MED ORDER — ACETAMINOPHEN 325 MG PO TABS
650.0000 mg | ORAL_TABLET | Freq: Four times a day (QID) | ORAL | Status: DC | PRN
  Administered 2019-08-28 – 2019-08-29 (×6): 650 mg via ORAL
  Filled 2019-08-27 (×6): qty 2

## 2019-08-27 MED ORDER — ONDANSETRON HCL 4 MG PO TABS: 4.0000 mg | ORAL_TABLET | Freq: Four times a day (QID) | ORAL | Status: DC | PRN

## 2019-08-27 MED ORDER — IOHEXOL 350 MG/ML SOLN
100.0000 mL | Freq: Once | INTRAVENOUS | Status: AC | PRN
  Administered 2019-08-27: 100 mL via INTRAVENOUS

## 2019-08-27 MED ORDER — CHLORHEXIDINE GLUCONATE CLOTH 2 % EX PADS
6.0000 | MEDICATED_PAD | Freq: Every day | CUTANEOUS | Status: DC
  Administered 2019-08-27 – 2019-08-29 (×3): 6 via TOPICAL

## 2019-08-27 MED ORDER — ONDANSETRON HCL 4 MG/2ML IJ SOLN: 4.0000 mg | Freq: Four times a day (QID) | INTRAMUSCULAR | Status: DC | PRN

## 2019-08-27 MED ORDER — MORPHINE SULFATE (PF) 2 MG/ML IV SOLN
2.0000 mg | INTRAVENOUS | Status: DC | PRN
  Administered 2019-08-30: 10:00:00 2 mg via INTRAVENOUS
  Filled 2019-08-27: qty 1

## 2019-08-27 MED ORDER — ACETAMINOPHEN 650 MG RE SUPP: 650.0000 mg | Freq: Four times a day (QID) | RECTAL | Status: DC | PRN

## 2019-08-27 MED ORDER — HEPARIN BOLUS VIA INFUSION
4000.0000 [IU] | Freq: Once | INTRAVENOUS | Status: AC
  Administered 2019-08-27: 4000 [IU] via INTRAVENOUS

## 2019-08-27 MED ORDER — SODIUM CHLORIDE 0.45 % IV SOLN: INTRAVENOUS | Status: DC

## 2019-08-27 MED ORDER — ORAL CARE MOUTH RINSE
15.0000 mL | Freq: Two times a day (BID) | OROMUCOSAL | Status: DC
  Administered 2019-08-28 – 2019-09-04 (×14): 15 mL via OROMUCOSAL

## 2019-08-27 NOTE — Progress Notes (Signed)
ANTICOAGULATION CONSULT NOTE - Initial Consult  Pharmacy Consult for Heparin Indication: pulmonary embolus  Allergies  Allergen Reactions  . Hydrocodone Rash    Mild rash one time, but has taken other agents with no reaction    Patient Measurements: Height: 6' (182.9 cm) Weight: 252 lb (114.3 kg) IBW/kg (Calculated) : 77.6 Heparin Dosing Weight: 100 kg  Vital Signs: Temp: 97.9 F (36.6 C) (01/17 0704) Temp Source: Oral (01/17 0704) BP: 143/88 (01/17 0703) Pulse Rate: 82 (01/17 0703)  Labs: Recent Labs    08/27/19 0522  HGB 14.4  HCT 42.8  PLT 159  CREATININE 0.97  TROPONINIHS 7    Estimated Creatinine Clearance: 70 mL/min (by C-G formula based on SCr of 0.97 mg/dL).   Medical History: Past Medical History:  Diagnosis Date  . Daily headache   . GERD (gastroesophageal reflux disease)   . History of duodenal ulcer   . History of kidney stones   . Osteoporosis   . Peripheral neuropathy   . Prostate CA ([place]) 2000s  . Varicose veins of both lower extremities   . Ventral hernia    "has had it for sometime; at least 3 yrs" (01/19/2018)    Medications:  No current facility-administered medications on file prior to encounter.   Current Outpatient Medications on File Prior to Encounter  Medication Sig Dispense Refill  . acetaminophen (TYLENOL) 500 MG tablet Take 2 tablets (1,000 mg total) by mouth every 8 (eight) hours as needed for moderate pain. 30 tablet 0  . Besifloxacin HCl (BESIVANCE) 0.6 % SUSP Place 1 drop into the right eye 4 (four) times daily.     . cycloSPORINE (RESTASIS) 0.05 % ophthalmic emulsion Place 1 drop into both eyes every 12 hours.    . DULoxetine (CYMBALTA) 60 MG capsule Take by mouth.    . furosemide (LASIX) 20 MG tablet Take by mouth.    . gabapentin (NEURONTIN) 300 MG capsule Take 300 mg by mouth 2 (two) times daily.     . Multiple Vitamins-Minerals (EYE VITAMINS & MINERALS) TABS Take by mouth.    . natamycin (NATACYN) 5 % ophthalmic  suspension Place 1 drop into the right eye every 2 (two) hours while awake.       Assessment: 86 y.o. male with bilateral PE for heparin Goal of Therapy:  Heparin level 0.3-0.7 units/ml Monitor platelets by anticoagulation protocol: Yes   Plan:  Heparin 5000 units IV bolus, then start heparin 1600 units/hr Check heparin level in 8 hours.   Caryl Pina 08/27/2019,7:13 AM

## 2019-08-27 NOTE — ED Notes (Signed)
Attempt x 2 made to call report to [facility] ([phone]), no answer

## 2019-08-27 NOTE — ED Triage Notes (Signed)
Pt arrives with sudden onset dizziness and emesis after getting up Similar to hx of vertigo. EMS reports 85% room air - triage RN noted 86% on room air in ED. Placed on 3L with increase to 96%. Fall at home as well - resulted in two skin tears to left arm.    [place] independent living resident.

## 2019-08-27 NOTE — ED Notes (Signed)
Pt's oxygen saturation down to 86% after EDP turned off oxygen via [place]. Placed on 2L [place], back up to 97%. Portable XRAY at bedside

## 2019-08-27 NOTE — H&P (Signed)
History and Physical   Dustin Kelsay Sr. OTR:[id number] DOB: October 28, 1929 DOA: 08/27/2019  Referring MD/NP/PA: Dr. Tyrone Nine  PCP: Dustin Glazier, MD   Outpatient Specialists: None  Patient coming from: Home where he lives with his wife  Chief Complaint: Fall and dizzy  HPI: Dustin Joslin Sr. is a 86 y.o. male with medical history significant of peripheral neuropathy, history of prostate cancer, GERD, prior duodenal ulcer, osteoporosis and ventral hernia who has had previous falls last year with left-sided rib fractures.  Patient started feeling dizzy about 3 days ago.  He usually is active walking around the neighborhood.  Lately his activities have been decreased with the COVID-19.  Patient noted the dizziness but has not had any shortness of breath or cough.  Today however he fell and had lacerations mainly on his left upper and lower extremities as well as the rib cage where he had previous rib fractures.  Wife is a retired Marine scientist.  She came and helped him and dress his wounds.  She brought him to the ER for evaluation.  In the ER evaluation showed submassive pulmonary embolism.  Patient also mildly hypoxic.  He was seen at [facility] where he was initiated on IV heparin and sent over for further work-up.  He denied any fever or chills.  Denied any cough.  Denied any sick contacts.  COVID-19 screen done so far negative.  ED Course: Temperature 97.7 blood pressure 137/86 pulse 85 respiratory rate of 20 oxygen sats 86% on room air.  Currently on 2 L.  His CBC is entirely within normal.  Chemistry is also entirely within normal except for glucose of 126.  COVID-19 screen is negative.  CT angiogram of the chest showed acute bilateral occlusive pulmonary thromboemboli.  Severe clot burden.  There was CT evidence of right heart strain with a ratio of of RV LV 121 consistent with at least submassive PE.  No evidence of infection.  Patient being admitted after initiation of heparin  drip.  Review of Systems: As per HPI otherwise 10 point review of systems negative.    Past Medical History:  Diagnosis Date  . Daily headache   . GERD (gastroesophageal reflux disease)   . History of duodenal ulcer   . History of kidney stones   . Osteoporosis   . Peripheral neuropathy   . Prostate CA ([place]) 2000s  . Varicose veins of both lower extremities   . Ventral hernia    "has had it for sometime; at least 3 yrs" (01/19/2018)    Past Surgical History:  Procedure Laterality Date  . CATARACT EXTRACTION, BILATERAL Bilateral 1980s   "iris hung implant in right eye only; implant went bad w/in 3 years"  . CORNEAL TRANSPLANT Right 1980s  . EYE SURGERY    . FRACTURE SURGERY    . HERNIA REPAIR    . KNEE ARTHROSCOPY Right 1980s  . LAPAROSCOPIC CHOLECYSTECTOMY  ~ 1998  . PROSTATECTOMY  2003  . THYROID SURGERY  ~ 1952 & ~ 1954   "thyroid tumor"  . TRANSURETHRAL RESECTION OF PROSTATE  1990s   "several; for ringneck bladder"  . UMBILICAL HERNIA REPAIR  ~ 1998   "w/gallbladder OR"  . WRIST FRACTURE SURGERY Left 1995     reports that he quit smoking about 32 years ago. His smoking use included cigarettes. He has a 19.00 pack-year smoking history. He has never used smokeless tobacco. He reports current alcohol use of about 7.0 standard drinks of alcohol per week.  He reports that he does not use drugs.  Allergies  Allergen Reactions  . Hydrocodone Rash    Mild rash one time, but has taken other agents with no reaction    History reviewed. No pertinent family history.   Prior to Admission medications   Medication Sig Start Date End Date Taking? Authorizing Provider  Cholecalciferol (VITAMIN D3 PO) Take 5,000 Units by mouth daily.   Yes [provider]  Cyanocobalamin (VITAMIN B-12 IJ) Inject as directed every 30 (thirty) days.   Yes [provider]  Multiple Vitamins-Minerals (OCUVITE PO) Take by mouth daily.   Yes [provider]  Polyvinyl  Alcohol-Povidone ([place] OP) Apply to eye as needed.   Yes [provider]  acetaminophen (TYLENOL) 500 MG tablet Take 2 tablets (1,000 mg total) by mouth every 8 (eight) hours as needed for moderate pain. 01/22/18   Excell Seltzer, MD  Besifloxacin HCl (BESIVANCE) 0.6 % SUSP Place 1 drop into the right eye 4 (four) times daily.  12/17/17   [provider]  cycloSPORINE (RESTASIS) 0.05 % ophthalmic emulsion Place 1 drop into both eyes every 12 hours. 07/26/15   [provider]  DULoxetine (CYMBALTA) 60 MG capsule Take by mouth at bedtime.  12/22/16   [provider]  furosemide (LASIX) 20 MG tablet Take by mouth daily.  01/26/17   [provider]  gabapentin (NEURONTIN) 300 MG capsule Take 300 mg by mouth 2 (two) times daily.  01/24/16   [provider]  Multiple Vitamins-Minerals (EYE VITAMINS & MINERALS) TABS Take by mouth. 04/21/17   [provider]  natamycin (NATACYN) 5 % ophthalmic suspension Place 1 drop into the right eye every 2 (two) hours while awake.    [provider]    Physical Exam: Vitals:   08/27/19 2200 08/27/19 2300 08/27/19 2335 08/28/19 0000  BP: (!) 162/92 135/71 (!) 155/99   Pulse: 82 90 88   Resp: 17 (!) 24 20   Temp:    97.9 F (36.6 C)  TempSrc:    Oral  SpO2: 96% 97% 98%   Weight:      Height:          Constitutional: Frail and hard of hearing Vitals:   08/27/19 2200 08/27/19 2300 08/27/19 2335 08/28/19 0000  BP: (!) 162/92 135/71 (!) 155/99   Pulse: 82 90 88   Resp: 17 (!) 24 20   Temp:    97.9 F (36.6 C)  TempSrc:    Oral  SpO2: 96% 97% 98%   Weight:      Height:       Eyes: PERRL, lids and conjunctivae normal ENMT: Mucous membranes are moist. Posterior pharynx clear of any exudate or lesions.Normal dentition.  Neck: normal, supple, no masses, no thyromegaly Respiratory: Coarse breath sounds bilaterally, no wheezing, no crackles. Normal respiratory effort. No accessory  muscle use.  Cardiovascular: Tachycardia, no murmurs / rubs / gallops. No extremity edema. 2+ pedal pulses. No carotid bruits.  Abdomen: no tenderness, no masses palpated. No hepatosplenomegaly. Bowel sounds positive.  Musculoskeletal: no clubbing / cyanosis. No joint deformity upper and lower extremities. Good ROM, no contractures. Normal muscle tone.  Skin: no rashes, lesions, ulcers. No induration Neurologic: CN 2-12 grossly intact. Sensation intact, DTR normal. Strength 5/5 in all 4.  Decreased hearing otherwise no focal findings Psychiatric: Normal judgment and insight. Alert and oriented x 3. Normal mood.     Labs on Admission: I have personally reviewed following labs and imaging  studies  CBC: Recent Labs  Lab 08/27/19 0522  WBC 6.5  NEUTROABS 4.8  HGB 14.4  HCT 42.8  MCV 91.1  PLT 664   Basic Metabolic Panel: Recent Labs  Lab 08/27/19 0522  NA 140  K 3.9  CL 106  CO2 26  GLUCOSE 126*  BUN 16  CREATININE 0.97  CALCIUM 8.7*   GFR: Estimated Creatinine Clearance: 69.7 mL/min (by C-G formula based on SCr of 0.97 mg/dL). Liver Function Tests: No results for input(s): AST, ALT, ALKPHOS, BILITOT, PROT, ALBUMIN in the last 168 hours. No results for input(s): LIPASE, AMYLASE in the last 168 hours. No results for input(s): AMMONIA in the last 168 hours. Coagulation Profile: No results for input(s): INR, PROTIME in the last 168 hours. Cardiac Enzymes: No results for input(s): CKTOTAL, CKMB, CKMBINDEX, TROPONINI in the last 168 hours. BNP (last 3 results) No results for input(s): PROBNP in the last 8760 hours. HbA1C: No results for input(s): HGBA1C in the last 72 hours. CBG: No results for input(s): GLUCAP in the last 168 hours. Lipid Profile: No results for input(s): CHOL, HDL, LDLCALC, TRIG, CHOLHDL, LDLDIRECT in the last 72 hours. Thyroid Function Tests: No results for input(s): TSH, T4TOTAL, FREET4, T3FREE, THYROIDAB in the last 72 hours. Anemia Panel: No  results for input(s): VITAMINB12, FOLATE, FERRITIN, TIBC, IRON, RETICCTPCT in the last 72 hours. Urine analysis:    Component Value Date/Time   COLORURINE YELLOW 01/18/2018 1632   APPEARANCEUR CLEAR 01/18/2018 1632   LABSPEC 1.020 01/18/2018 1632   PHURINE 7.0 01/18/2018 1632   GLUCOSEU NEGATIVE 01/18/2018 1632   HGBUR SMALL (A) 01/18/2018 1632   BILIRUBINUR NEGATIVE 01/18/2018 1632   KETONESUR NEGATIVE 01/18/2018 1632   PROTEINUR NEGATIVE 01/18/2018 1632   NITRITE NEGATIVE 01/18/2018 1632   LEUKOCYTESUR NEGATIVE 01/18/2018 1632   Sepsis Labs: _0 (procalcitonin:4,lacticidven:4) ) Recent Results (from the past 240 hour(s))  SARS Coronavirus 2 Ag (30 min TAT) - Nasal Swab (BD Veritor Kit)     Status: None   Collection Time: 08/27/19  5:22 AM   Specimen: Nasal Swab (BD Veritor Kit)  Result Value Ref Range Status   SARS Coronavirus 2 Ag NEGATIVE NEGATIVE Final    Comment: (NOTE) SARS-CoV-2 antigen NOT DETECTED.  Negative results are presumptive.  Negative results do not preclude SARS-CoV-2 infection and should not be used as the sole basis for treatment or other patient management decisions, including infection  control decisions, particularly in the presence of clinical signs and  symptoms consistent with COVID-19, or in those who have been in contact with the virus.  Negative results must be combined with clinical observations, patient history, and epidemiological information. The expected result is Negative. Fact Sheet for Patients: PodPark.tn Fact Sheet for Healthcare Providers: GiftContent.is This test is not yet approved or cleared by the [place] FDA and  has been authorized for detection and/or diagnosis of SARS-CoV-2 by FDA under an Emergency Use Authorization (EUA).  This EUA will remain in effect (meaning this test can be used) for the duration of  the COVID-19 de claration under Section 564(b)(1)  of the Act, 21 U.S.C. section 360bbb-3(b)(1), unless the authorization is terminated or revoked sooner. Performed at [facility] [address] [place], [place] [address]   SARS CORONAVIRUS 2 (TAT 6-24 HRS) Nasopharyngeal Nasopharyngeal Swab     Status: None   Collection Time: 08/27/19  8:00 AM   Specimen: Nasopharyngeal Swab  Result Value Ref Range Status   SARS Coronavirus 2 NEGATIVE NEGATIVE  Final    Comment: (NOTE) SARS-CoV-2 target nucleic acids are NOT DETECTED. The SARS-CoV-2 RNA is generally detectable in upper and lower respiratory specimens during the acute phase of infection. Negative results do not preclude SARS-CoV-2 infection, do not rule out co-infections with other pathogens, and should not be used as the sole basis for treatment or other patient management decisions. Negative results must be combined with clinical observations, patient history, and epidemiological information. The expected result is Negative. Fact Sheet for Patients: SugarRoll.be Fact Sheet for Healthcare Providers: https://www.woods-mathews.com/ This test is not yet approved or cleared by the [place] FDA and  has been authorized for detection and/or diagnosis of SARS-CoV-2 by FDA under an Emergency Use Authorization (EUA). This EUA will remain  in effect (meaning this test can be used) for the duration of the COVID-19 declaration under Section 56 4(b)(1) of the Act, 21 U.S.C. section 360bbb-3(b)(1), unless the authorization is terminated or revoked sooner. Performed at [facility], [address] [place], [place] [address]      Radiological Exams on Admission: CT Angio Chest PE W and/or Wo Contrast  Result Date: 08/27/2019 CLINICAL DATA:  Dizziness, fall, rib pain common abdominal trauma EXAM: CT ANGIOGRAPHY CHEST CT ABDOMEN AND PELVIS WITH CONTRAST TECHNIQUE: Multidetector CT imaging of the chest was performed using the  standard protocol during bolus administration of intravenous contrast. Multiplanar CT image reconstructions and MIPs were obtained to evaluate the vascular anatomy. Multidetector CT imaging of the abdomen and pelvis was performed using the standard protocol during bolus administration of intravenous contrast. CONTRAST:  139m OMNIPAQUE IOHEXOL 350 MG/ML SOLN COMPARISON:  CT 01/18/2018 FINDINGS: CTA CHEST FINDINGS Cardiovascular: Large filling defect within the distal RIGHT main pulmonary artery. Findings consistent with acute thromboembolism. This embolism extends into the lower lobe pulmonary arteries where there is occlusion of the posterior segments of the RIGHT lower lobe pulmonary arteries. Occlusive thrombus extends into the RIGHT upper lobe pulmonary are which are occlusive. Filling defect defect within the LEFT lower lobe pulmonary artery which is partially occlusive. Upper lobe branch occlusion also noted (image 80/6). Overall clot burden is severe. There is evidence of RIGHT ventricular strain with the RIGHT ventricular diameter to LEFT ventricular diameter ratio equal 1.1 (3.7: 3.5 cm) Mediastinum/Nodes: Trachea and esophagus normal.  No lymphadenopathy Lungs/Pleura: No pulmonary infarction. Subpleural reticulation throughout the lungs. No pleural fluid. No pneumothorax. There is a blood within the posterior aspect of the superior segment LEFT lower lobe. Musculoskeletal: No acute osseous abnormality. Review of the MIP images confirms the above findings. CT ABDOMEN and PELVIS FINDINGS Hepatobiliary: No focal hepatic lesion. Postcholecystectomy. No biliary dilatation. Pancreas: Pancreas is normal. No ductal dilatation. No pancreatic inflammation. Spleen: Normal spleen Adrenals/urinary tract: Adrenal glands and kidneys are normal. The ureters and bladder normal. Stomach/Bowel: Stomach, small bowel, appendix, and cecum are normal. The colon and rectosigmoid colon are normal. Vascular/Lymphatic: Abdominal  aorta is normal caliber with atherosclerotic calcification. There is no retroperitoneal or periportal lymphadenopathy. No pelvic lymphadenopathy. Reproductive: Post prostatectomy Other: No free fluid. Musculoskeletal: Degenerative osteophytosis of the spine. Review of the MIP images confirms the above findings. IMPRESSION: 1. Acute bilateral occlusive pulmonary thromboemboli. Overall clot burden is severe. 2. CT evidence of right heart strain (RV/LV Ratio = 1.1) consistent with at least submassive (intermediate risk) PE. The presence of right heart strain has been associated with an increased risk of morbidity and mortality. 3. No pulmonary infarction.  No airspace disease 4. No acute findings in the abdomen pelvis.  No evidence of abdominal trauma. 5. Aortic Atherosclerosis (ICD10-I70.0). Critical Value/emergent results were called by telephone at the time of interpretation on 08/27/2019 at 7:07 am to [place] , who verbally acknowledged these results. Electronically Signed   By: Suzy Bouchard M.D.   On: 08/27/2019 07:11   CT ABDOMEN PELVIS W CONTRAST  Result Date: 08/27/2019 CLINICAL DATA:  Dizziness, fall, rib pain common abdominal trauma EXAM: CT ANGIOGRAPHY CHEST CT ABDOMEN AND PELVIS WITH CONTRAST TECHNIQUE: Multidetector CT imaging of the chest was performed using the standard protocol during bolus administration of intravenous contrast. Multiplanar CT image reconstructions and MIPs were obtained to evaluate the vascular anatomy. Multidetector CT imaging of the abdomen and pelvis was performed using the standard protocol during bolus administration of intravenous contrast. CONTRAST:  161m OMNIPAQUE IOHEXOL 350 MG/ML SOLN COMPARISON:  CT 01/18/2018 FINDINGS: CTA CHEST FINDINGS Cardiovascular: Large filling defect within the distal RIGHT main pulmonary artery. Findings consistent with acute thromboembolism. This embolism extends into the lower lobe pulmonary arteries where there is occlusion of  the posterior segments of the RIGHT lower lobe pulmonary arteries. Occlusive thrombus extends into the RIGHT upper lobe pulmonary are which are occlusive. Filling defect defect within the LEFT lower lobe pulmonary artery which is partially occlusive. Upper lobe branch occlusion also noted (image 80/6). Overall clot burden is severe. There is evidence of RIGHT ventricular strain with the RIGHT ventricular diameter to LEFT ventricular diameter ratio equal 1.1 (3.7: 3.5 cm) Mediastinum/Nodes: Trachea and esophagus normal.  No lymphadenopathy Lungs/Pleura: No pulmonary infarction. Subpleural reticulation throughout the lungs. No pleural fluid. No pneumothorax. There is a blood within the posterior aspect of the superior segment LEFT lower lobe. Musculoskeletal: No acute osseous abnormality. Review of the MIP images confirms the above findings. CT ABDOMEN and PELVIS FINDINGS Hepatobiliary: No focal hepatic lesion. Postcholecystectomy. No biliary dilatation. Pancreas: Pancreas is normal. No ductal dilatation. No pancreatic inflammation. Spleen: Normal spleen Adrenals/urinary tract: Adrenal glands and kidneys are normal. The ureters and bladder normal. Stomach/Bowel: Stomach, small bowel, appendix, and cecum are normal. The colon and rectosigmoid colon are normal. Vascular/Lymphatic: Abdominal aorta is normal caliber with atherosclerotic calcification. There is no retroperitoneal or periportal lymphadenopathy. No pelvic lymphadenopathy. Reproductive: Post prostatectomy Other: No free fluid. Musculoskeletal: Degenerative osteophytosis of the spine. Review of the MIP images confirms the above findings. IMPRESSION: 1. Acute bilateral occlusive pulmonary thromboemboli. Overall clot burden is severe. 2. CT evidence of right heart strain (RV/LV Ratio = 1.1) consistent with at least submassive (intermediate risk) PE. The presence of right heart strain has been associated with an increased risk of morbidity and mortality. 3. No  pulmonary infarction.  No airspace disease 4. No acute findings in the abdomen pelvis. No evidence of abdominal trauma. 5. Aortic Atherosclerosis (ICD10-I70.0). Critical Value/emergent results were called by telephone at the time of interpretation on 08/27/2019 at 7:07 am to [place], who verbally acknowledged these results. Electronically Signed   By: SSuzy BouchardM.D.   On: 08/27/2019 07:11   DG Chest Port 1 View  Result Date: 08/27/2019 CLINICAL DATA:  Lt rib pain s/p fall, s/p dizziness w/ nANDv, low O2 sats, mult old lt rib fxs//acleft chest wall pain EXAM: PORTABLE CHEST 1 VIEW COMPARISON:  Radiograph 01/18/2018, CT 01/18/2018 FINDINGS: Enlarged cardiac silhouette. Low lung volumes. Prominent vascular densities in the upper lobes. No change from prior. Mild peripheral reticulation similar to prior. IMPRESSION: 1. No change from comparison exam. 2. Cardiomegaly and mild interstitial lung disease Electronically Signed  By: Suzy Bouchard M.D.   On: 08/27/2019 06:12    EKG: Independently reviewed.  It shows normal sinus rhythm with a rate of 81, poor R wave progression, no significant ST changes.  Assessment/Plan Principal Problem:   Pulmonary embolism (HCC) Active Problems:   GERD (gastroesophageal reflux disease)   Fall at home, initial encounter     #1 submassive pulmonary embolism: Patient has evidence of right heart strain on CT.  We will admit to stepdown unit.  Already on IV heparin.  Get echocardiogram in the morning as well as Doppler ultrasound of the lower extremities.  Bedrest for now.  Patient is a very active 86 year old.  We may require vascular surgery if echo shows significant hemodynamic abnormalities.  Ultimately transition to oral anticoagulation.  Patient has history of duodenal ulcer but no evidence of previous GI bleed.  Will therefore be careful regarding that.  #2 GERD: Initiate PPI especially in the setting of anticoagulation with history of duodenal  ulcer.  #3 fall: Due to gait abnormalities.  Patient apparently has had recurrent falls at home.  At this point he has multiple bruises.  PT OT.  Lives at home with the wife.  May be a candidate for short-term rehab afterwards.   DVT prophylaxis: Heparin drip Code Status: Full code Family Communication: Wife over the phone Disposition Plan: Home Consults called: None Admission status: Inpatient  Severity of Illness: The appropriate patient status for this patient is INPATIENT. Inpatient status is judged to be reasonable and necessary in order to provide the required intensity of service to ensure the patient's safety. The patient's presenting symptoms, physical exam findings, and initial radiographic and laboratory data in the context of their chronic comorbidities is felt to place them at high risk for further clinical deterioration. Furthermore, it is not anticipated that the patient will be medically stable for discharge from the hospital within 2 midnights of admission. The following factors support the patient status of inpatient.   " The patient's presenting symptoms include fall with dizziness. " The worrisome physical exam findings include decreased air entry bilaterally. " The initial radiographic and laboratory data are worrisome because of massive pulmonary embolism. " The chronic co-morbidities include GERD.   * I certify that at the point of admission it is my clinical judgment that the patient will require inpatient hospital care spanning beyond 2 midnights from the point of admission due to high intensity of service, high risk for further deterioration and high frequency of surveillance required.Barbette Merino MD [facility] Pager [phone]  If 7PM-7AM, please contact night-coverage [email or web address] Password [email or web address]  08/28/2019, 12:20 AM

## 2019-08-27 NOTE — ED Notes (Signed)
Pt is asleep

## 2019-08-27 NOTE — Progress Notes (Signed)
ANTICOAGULATION CONSULT NOTE - Initial Consult  Pharmacy Consult for Heparin Indication: pulmonary embolus  Allergies  Allergen Reactions  . Hydrocodone Rash    Mild rash one time, but has taken other agents with no reaction    Patient Measurements: Height: 6' (182.9 cm) Weight: 252 lb (114.3 kg) IBW/kg (Calculated) : 77.6 Heparin Dosing Weight: 100 kg  Vital Signs: Temp: 97.9 F (36.6 C) (01/17 0704) Temp Source: Oral (01/17 0704) BP: 136/93 (01/17 1600) Pulse Rate: 90 (01/17 1600)  Labs: Recent Labs    08/27/19 0522 08/27/19 1415  HGB 14.4  --   HCT 42.8  --   PLT 159  --   HEPARINUNFRC  --  0.71*  CREATININE 0.97  --   TROPONINIHS 7  --     Estimated Creatinine Clearance: 70 mL/min (by C-G formula based on SCr of 0.97 mg/dL).   Medical History: Past Medical History:  Diagnosis Date  . Daily headache   . GERD (gastroesophageal reflux disease)   . History of duodenal ulcer   . History of kidney stones   . Osteoporosis   . Peripheral neuropathy   . Prostate CA ([place]) 2000s  . Varicose veins of both lower extremities   . Ventral hernia    "has had it for sometime; at least 3 yrs" (01/19/2018)    Medications:  No current facility-administered medications on file prior to encounter.   Current Outpatient Medications on File Prior to Encounter  Medication Sig Dispense Refill  . acetaminophen (TYLENOL) 500 MG tablet Take 2 tablets (1,000 mg total) by mouth every 8 (eight) hours as needed for moderate pain. 30 tablet 0  . Besifloxacin HCl (BESIVANCE) 0.6 % SUSP Place 1 drop into the right eye 4 (four) times daily.     . cycloSPORINE (RESTASIS) 0.05 % ophthalmic emulsion Place 1 drop into both eyes every 12 hours.    . DULoxetine (CYMBALTA) 60 MG capsule Take by mouth.    . furosemide (LASIX) 20 MG tablet Take by mouth.    . gabapentin (NEURONTIN) 300 MG capsule Take 300 mg by mouth 2 (two) times daily.     . Multiple Vitamins-Minerals (EYE VITAMINS & MINERALS)  TABS Take by mouth.    . natamycin (NATACYN) 5 % ophthalmic suspension Place 1 drop into the right eye every 2 (two) hours while awake.       Assessment: 86 y.o. male  That presented tot he ed with left sided chest pain. The patient was found to have bilateral PEs with sever clot burden after further workup. Pharmacy has been asked to dose heparin in this patient.   Goal of Therapy:  Heparin level 0.3-0.7 units/ml Monitor platelets by anticoagulation protocol: Yes   Plan:  - Heparin level is slightly supra-therapeutic at 0.71  - Will decrease heparin drip to 1500 units/hr  - Check heparin level in 6 hours  - Monitor patient for s/s of bleeding and [place]  PharmD. BCPS  08/27/2019,4:17 PM

## 2019-08-27 NOTE — ED Provider Notes (Signed)
[facility] EMERGENCY DEPARTMENT Provider Note   CSN: [id number] Arrival date & time: 08/27/19  0447     History Chief Complaint  Patient presents with  . Fall  . Dizziness    Dustin Jungwirth Sr. is a 86 y.o. male.  86 yo M with a chief complaints of a fall.  Patient states that he got up to go to the bathroom and when he got there he suddenly felt very dizzy and was able to hold himself up and call for his wife who came in helped him down to the ground.  He ended up losing his balance and bumping his arm against a sink and then he fell on the left side of his chest.  Complaining of some pain to the left chest wall and the skin of his arm.  Denies head injury denies loss consciousness denies neck pain back pain abdominal pain chest pain.  He states before this morning he was in his normal level of health.  Has some chronic lower extremity edema that has not significantly changed.  He states that when EMS got to him he was sick and he vomited.  Denied prior episodes of vomiting.  Says he has been eating and drinking normally.  No fevers no cough.  The history is provided by the patient.  Fall This is a new problem. The current episode started yesterday. The problem occurs constantly. The problem has not changed since onset.Associated symptoms include chest pain. Pertinent negatives include no abdominal pain, no headaches and no shortness of breath. Nothing aggravates the symptoms. Nothing relieves the symptoms. He has tried nothing for the symptoms. The treatment provided no relief.  Dizziness Associated symptoms: chest pain   Associated symptoms: no diarrhea, no headaches, no palpitations, no shortness of breath and no vomiting        Past Medical History:  Diagnosis Date  . Daily headache   . GERD (gastroesophageal reflux disease)   . History of duodenal ulcer   . History of kidney stones   . Osteoporosis   . Peripheral neuropathy   . Prostate CA ([place]) 2000s  .  Varicose veins of both lower extremities   . Ventral hernia    "has had it for sometime; at least 3 yrs" (01/19/2018)    Patient Active Problem List   Diagnosis Date Noted  . GERD (gastroesophageal reflux disease) 08/28/2019  . Fall at home, initial encounter 08/28/2019  . Pulmonary embolism ([place]) 08/27/2019  . Multiple rib fractures 01/18/2018  . Liver laceration, grade I 01/18/2018    Past Surgical History:  Procedure Laterality Date  . CATARACT EXTRACTION, BILATERAL Bilateral 1980s   "iris hung implant in right eye only; implant went bad w/in 3 years"  . CORNEAL TRANSPLANT Right 1980s  . EYE SURGERY    . FRACTURE SURGERY    . HERNIA REPAIR    . KNEE ARTHROSCOPY Right 1980s  . LAPAROSCOPIC CHOLECYSTECTOMY  ~ 1998  . PROSTATECTOMY  2003  . THYROID SURGERY  ~ 1952 & ~ 1954   "thyroid tumor"  . TRANSURETHRAL RESECTION OF PROSTATE  1990s   "several; for ringneck bladder"  . UMBILICAL HERNIA REPAIR  ~ 1998   "w/gallbladder OR"  . WRIST FRACTURE SURGERY Left 1995       History reviewed. No pertinent family history.  Social History   Tobacco Use  . Smoking status: Former Smoker    Packs/day: 1.00    Years: 19.00    Pack years: 19.00  Types: Cigarettes    Quit date: 08/11/1987    Years since quitting: 32.0  . Smokeless tobacco: Never Used  Substance Use Topics  . Alcohol use: Yes    Alcohol/week: 7.0 standard drinks    Types: 7 Glasses of wine per week  . Drug use: Never    Home Medications Prior to Admission medications   Medication Sig Start Date End Date Taking? Authorizing Provider  Cholecalciferol (VITAMIN D3 PO) Take 5,000 Units by mouth daily.   Yes [provider]  cyanocobalamin (,VITAMIN B-12,) 1000 MCG/ML injection Inject 1 mL into the muscle every 30 (thirty) days. 06/19/19  Yes [provider]  DULoxetine (CYMBALTA) 60 MG capsule Take 60 mg by mouth at bedtime.  12/22/16  Yes [provider]  furosemide (LASIX) 20 MG tablet  Take 20 mg by mouth daily.  01/26/17  Yes [provider]  gabapentin (NEURONTIN) 300 MG capsule Take 300 mg by mouth 2 (two) times daily.  01/24/16  Yes [provider]  Multiple Vitamins-Minerals (OCUVITE PO) Take 1 tablet by mouth daily.    Yes [provider]  Polyvinyl Alcohol-Povidone (REFRESH OP) Apply 1 drop to eye daily as needed (dry eyes).    Yes [provider]  acetaminophen (TYLENOL) 500 MG tablet Take 2 tablets (1,000 mg total) by mouth every 8 (eight) hours as needed for moderate pain. 01/22/18   Excell Seltzer, MD    Allergies    Hydrocodone  Review of Systems   Review of Systems  Constitutional: Negative for chills and fever.  HENT: Negative for congestion and facial swelling.   Eyes: Negative for discharge and visual disturbance.  Respiratory: Negative for shortness of breath.   Cardiovascular: Positive for chest pain. Negative for palpitations.  Gastrointestinal: Negative for abdominal pain, diarrhea and vomiting.  Musculoskeletal: Negative for arthralgias and myalgias.  Skin: Negative for color change and rash.  Neurological: Positive for dizziness. Negative for tremors, syncope and headaches.  Psychiatric/Behavioral: Negative for confusion and dysphoric mood.    Physical Exam Updated Vital Signs BP (!) 155/99   Pulse 88   Temp 97.9 F (36.6 C) (Oral)   Resp 20   Ht 6' (1.829 m)   Wt 113 kg   SpO2 98%   BMI 33.79 kg/m   Physical Exam Vitals and nursing note reviewed.  Constitutional:      Appearance: He is well-developed.  HENT:     Head: Normocephalic and atraumatic.  Eyes:     Pupils: Pupils are equal, round, and reactive to light.  Neck:     Vascular: No JVD.  Cardiovascular:     Rate and Rhythm: Normal rate and regular rhythm.     Heart sounds: No murmur. No friction rub. No gallop.   Pulmonary:     Effort: No respiratory distress.     Breath sounds: No wheezing.  Abdominal:     General: There is no  distension.     Tenderness: There is no guarding or rebound.  Musculoskeletal:        General: Normal range of motion.     Cervical back: Normal range of motion and neck supple.     Comments: Skin tear to the left posterior upper arm and left elbow.  No bony tenderness.  Pain about the left anterior axillary line about ribs 7 through 10.  Skin:    Coloration: Skin is not pale.     Findings: No rash.  Neurological:     Mental Status: He  is alert and oriented to person, place, and time.  Psychiatric:        Behavior: Behavior normal.     ED Results / Procedures / Treatments   Labs (all labs ordered are listed, but only abnormal results are displayed) Labs Reviewed  CBC WITH DIFFERENTIAL/PLATELET - Abnormal; Notable for the following components:      Result Value   Abs Immature Granulocytes 0.14 (*)    All other components within normal limits  BASIC METABOLIC PANEL - Abnormal; Notable for the following components:   Glucose, Bld 126 (*)    Calcium 8.7 (*)    All other components within normal limits  HEPARIN LEVEL (UNFRACTIONATED) - Abnormal; Notable for the following components:   Heparin Unfractionated 0.71 (*)    All other components within normal limits  COMPREHENSIVE METABOLIC PANEL - Abnormal; Notable for the following components:   Glucose, Bld 110 (*)    Calcium 8.8 (*)    Total Protein 6.2 (*)    All other components within normal limits  SARS CORONAVIRUS 2 AG (30 MIN TAT)  SARS CORONAVIRUS 2 (TAT 6-24 HRS)  BRAIN NATRIURETIC PEPTIDE  CBC  HEPARIN LEVEL (UNFRACTIONATED)  TROPONIN I (HIGH SENSITIVITY)    EKG EKG Interpretation  Date/Time:  Sunday August 27 2019 04:48:43 EST Ventricular Rate:  84 PR Interval:    QRS Duration: 101 QT Interval:  411 QTC Calculation: 486 R Axis:   -12 Text Interpretation: Sinus rhythm Atrial premature complex Abnormal R-wave progression, early transition Borderline prolonged QT interval wandering baseline Otherwise no  significant change Confirmed by Deno Etienne [phone]) on 08/27/2019 5:40:34 AM   Radiology CT Angio Chest PE W and/or Wo Contrast  Result Date: 08/27/2019 CLINICAL DATA:  Dizziness, fall, rib pain common abdominal trauma EXAM: CT ANGIOGRAPHY CHEST CT ABDOMEN AND PELVIS WITH CONTRAST TECHNIQUE: Multidetector CT imaging of the chest was performed using the standard protocol during bolus administration of intravenous contrast. Multiplanar CT image reconstructions and MIPs were obtained to evaluate the vascular anatomy. Multidetector CT imaging of the abdomen and pelvis was performed using the standard protocol during bolus administration of intravenous contrast. CONTRAST:  1103mL OMNIPAQUE IOHEXOL 350 MG/ML SOLN COMPARISON:  CT 01/18/2018 FINDINGS: CTA CHEST FINDINGS Cardiovascular: Large filling defect within the distal RIGHT main pulmonary artery. Findings consistent with acute thromboembolism. This embolism extends into the lower lobe pulmonary arteries where there is occlusion of the posterior segments of the RIGHT lower lobe pulmonary arteries. Occlusive thrombus extends into the RIGHT upper lobe pulmonary are which are occlusive. Filling defect defect within the LEFT lower lobe pulmonary artery which is partially occlusive. Upper lobe branch occlusion also noted (image 80/6). Overall clot burden is severe. There is evidence of RIGHT ventricular strain with the RIGHT ventricular diameter to LEFT ventricular diameter ratio equal 1.1 (3.7: 3.5 cm) Mediastinum/Nodes: Trachea and esophagus normal.  No lymphadenopathy Lungs/Pleura: No pulmonary infarction. Subpleural reticulation throughout the lungs. No pleural fluid. No pneumothorax. There is a blood within the posterior aspect of the superior segment LEFT lower lobe. Musculoskeletal: No acute osseous abnormality. Review of the MIP images confirms the above findings. CT ABDOMEN and PELVIS FINDINGS Hepatobiliary: No focal hepatic lesion. Postcholecystectomy. No  biliary dilatation. Pancreas: Pancreas is normal. No ductal dilatation. No pancreatic inflammation. Spleen: Normal spleen Adrenals/urinary tract: Adrenal glands and kidneys are normal. The ureters and bladder normal. Stomach/Bowel: Stomach, small bowel, appendix, and cecum are normal. The colon and rectosigmoid colon are normal. Vascular/Lymphatic: Abdominal aorta is normal caliber with atherosclerotic  calcification. There is no retroperitoneal or periportal lymphadenopathy. No pelvic lymphadenopathy. Reproductive: Post prostatectomy Other: No free fluid. Musculoskeletal: Degenerative osteophytosis of the spine. Review of the MIP images confirms the above findings. IMPRESSION: 1. Acute bilateral occlusive pulmonary thromboemboli. Overall clot burden is severe. 2. CT evidence of right heart strain (RV/LV Ratio = 1.1) consistent with at least submassive (intermediate risk) PE. The presence of right heart strain has been associated with an increased risk of morbidity and mortality. 3. No pulmonary infarction.  No airspace disease 4. No acute findings in the abdomen pelvis. No evidence of abdominal trauma. 5. Aortic Atherosclerosis (ICD10-I70.0). Critical Value/emergent results were called by telephone at the time of interpretation on 08/27/2019 at 7:07 am to [place] , who verbally acknowledged these results. Electronically Signed   By: Suzy Bouchard M.D.   On: 08/27/2019 07:11   CT ABDOMEN PELVIS W CONTRAST  Result Date: 08/27/2019 CLINICAL DATA:  Dizziness, fall, rib pain common abdominal trauma EXAM: CT ANGIOGRAPHY CHEST CT ABDOMEN AND PELVIS WITH CONTRAST TECHNIQUE: Multidetector CT imaging of the chest was performed using the standard protocol during bolus administration of intravenous contrast. Multiplanar CT image reconstructions and MIPs were obtained to evaluate the vascular anatomy. Multidetector CT imaging of the abdomen and pelvis was performed using the standard protocol during bolus  administration of intravenous contrast. CONTRAST:  138mL OMNIPAQUE IOHEXOL 350 MG/ML SOLN COMPARISON:  CT 01/18/2018 FINDINGS: CTA CHEST FINDINGS Cardiovascular: Large filling defect within the distal RIGHT main pulmonary artery. Findings consistent with acute thromboembolism. This embolism extends into the lower lobe pulmonary arteries where there is occlusion of the posterior segments of the RIGHT lower lobe pulmonary arteries. Occlusive thrombus extends into the RIGHT upper lobe pulmonary are which are occlusive. Filling defect defect within the LEFT lower lobe pulmonary artery which is partially occlusive. Upper lobe branch occlusion also noted (image 80/6). Overall clot burden is severe. There is evidence of RIGHT ventricular strain with the RIGHT ventricular diameter to LEFT ventricular diameter ratio equal 1.1 (3.7: 3.5 cm) Mediastinum/Nodes: Trachea and esophagus normal.  No lymphadenopathy Lungs/Pleura: No pulmonary infarction. Subpleural reticulation throughout the lungs. No pleural fluid. No pneumothorax. There is a blood within the posterior aspect of the superior segment LEFT lower lobe. Musculoskeletal: No acute osseous abnormality. Review of the MIP images confirms the above findings. CT ABDOMEN and PELVIS FINDINGS Hepatobiliary: No focal hepatic lesion. Postcholecystectomy. No biliary dilatation. Pancreas: Pancreas is normal. No ductal dilatation. No pancreatic inflammation. Spleen: Normal spleen Adrenals/urinary tract: Adrenal glands and kidneys are normal. The ureters and bladder normal. Stomach/Bowel: Stomach, small bowel, appendix, and cecum are normal. The colon and rectosigmoid colon are normal. Vascular/Lymphatic: Abdominal aorta is normal caliber with atherosclerotic calcification. There is no retroperitoneal or periportal lymphadenopathy. No pelvic lymphadenopathy. Reproductive: Post prostatectomy Other: No free fluid. Musculoskeletal: Degenerative osteophytosis of the spine. Review of the  MIP images confirms the above findings. IMPRESSION: 1. Acute bilateral occlusive pulmonary thromboemboli. Overall clot burden is severe. 2. CT evidence of right heart strain (RV/LV Ratio = 1.1) consistent with at least submassive (intermediate risk) PE. The presence of right heart strain has been associated with an increased risk of morbidity and mortality. 3. No pulmonary infarction.  No airspace disease 4. No acute findings in the abdomen pelvis. No evidence of abdominal trauma. 5. Aortic Atherosclerosis (ICD10-I70.0). Critical Value/emergent results were called by telephone at the time of interpretation on 08/27/2019 at 7:07 am to [place] , who verbally acknowledged these results. Electronically Signed  By: Suzy Bouchard M.D.   On: 08/27/2019 07:11   DG Chest Port 1 View  Result Date: 08/27/2019 CLINICAL DATA:  Lt rib pain s/p fall, s/p dizziness w/ nANDv, low O2 sats, mult old lt rib fxs//acleft chest wall pain EXAM: PORTABLE CHEST 1 VIEW COMPARISON:  Radiograph 01/18/2018, CT 01/18/2018 FINDINGS: Enlarged cardiac silhouette. Low lung volumes. Prominent vascular densities in the upper lobes. No change from prior. Mild peripheral reticulation similar to prior. IMPRESSION: 1. No change from comparison exam. 2. Cardiomegaly and mild interstitial lung disease Electronically Signed   By: Suzy Bouchard M.D.   On: 08/27/2019 06:12    Procedures Procedures (including critical care time)  Medications Ordered in ED Medications  heparin ADULT infusion 100 units/mL (25000 units/250mL sodium chloride 0.45%) (1,500 Units/hr Intravenous New Bag/Given 08/27/19 2133)  Chlorhexidine Gluconate Cloth 2 % PADS 6 each (6 each Topical Given 08/27/19 2218)  0.45 % sodium chloride infusion ( Intravenous New Bag/Given 08/27/19 2345)  ondansetron (ZOFRAN) tablet 4 mg (has no administration in time range)    Or  ondansetron (ZOFRAN) injection 4 mg (has no administration in time range)  acetaminophen (TYLENOL)  tablet 650 mg (650 mg Oral Given 08/28/19 0038)    Or  acetaminophen (TYLENOL) suppository 650 mg ( Rectal See Alternative 08/28/19 0038)  morphine 2 MG/ML injection 2 mg (has no administration in time range)  MEDLINE mouth rinse (has no administration in time range)  DULoxetine (CYMBALTA) DR capsule 60 mg (60 mg Oral Given 08/28/19 0042)  gabapentin (NEURONTIN) capsule 300 mg (300 mg Oral Given 08/28/19 0042)  cholecalciferol (VITAMIN D) tablet 5,000 Units (has no administration in time range)  cyanocobalamin ((VITAMIN B-12)) injection 1,000 mcg (has no administration in time range)  pantoprazole (PROTONIX) EC tablet 40 mg (has no administration in time range)  iohexol (OMNIPAQUE) 350 MG/ML injection 100 mL (100 mLs Intravenous Contrast Given 08/27/19 0613)  heparin bolus via infusion 4,000 Units (4,000 Units Intravenous Bolus from Bag 08/27/19 0738)    ED Course  I have reviewed the triage vital signs and the nursing notes.  Pertinent labs & imaging results that were available during my care of the patient were reviewed by me and considered in my medical decision making (see chart for details).    MDM Rules/Calculators/A&P                      86 yo M with a chief complaints of left-sided chest pain and left arm pain after a fall.  He also states that he was very dizzy which caused him to fall down.  Patient was found to be 86% on room air.  We will obtain a chest x-ray lab work.  As this is occurring during the novel coronavirus pandemic will obtain a rapid Covid test.  If the chest x-ray is unremarkable and the Covid test is negative then I likely will need to CT scan the patient's chest.  CT with PE.  Start on heparin.  Admit.   CRITICAL CARE Performed by: Cecilio Asper   Total critical care time: 35 minutes  Critical care time was exclusive of separately billable procedures and treating other patients.  Critical care was necessary to treat or prevent imminent or  life-threatening deterioration.  Critical care was time spent personally by me on the following activities: development of treatment plan with patient and/or surrogate as well as nursing, discussions with consultants, evaluation of patient's response to treatment, examination of patient, obtaining history  from patient or surrogate, ordering and performing treatments and interventions, ordering and review of laboratory studies, ordering and review of radiographic studies, pulse oximetry and re-evaluation of patient's condition.   The patients results and plan were reviewed and discussed.   Any x-rays performed were independently reviewed by myself.   Differential diagnosis were considered with the presenting HPI.  Medications  heparin ADULT infusion 100 units/mL (25000 units/253mL sodium chloride 0.45%) (1,500 Units/hr Intravenous New Bag/Given 08/27/19 2133)  Chlorhexidine Gluconate Cloth 2 % PADS 6 each (6 each Topical Given 08/27/19 2218)  0.45 % sodium chloride infusion ( Intravenous New Bag/Given 08/27/19 2345)  ondansetron (ZOFRAN) tablet 4 mg (has no administration in time range)    Or  ondansetron (ZOFRAN) injection 4 mg (has no administration in time range)  acetaminophen (TYLENOL) tablet 650 mg (650 mg Oral Given 08/28/19 0038)    Or  acetaminophen (TYLENOL) suppository 650 mg ( Rectal See Alternative 08/28/19 0038)  morphine 2 MG/ML injection 2 mg (has no administration in time range)  MEDLINE mouth rinse (has no administration in time range)  DULoxetine (CYMBALTA) DR capsule 60 mg (60 mg Oral Given 08/28/19 0042)  gabapentin (NEURONTIN) capsule 300 mg (300 mg Oral Given 08/28/19 0042)  cholecalciferol (VITAMIN D) tablet 5,000 Units (has no administration in time range)  cyanocobalamin ((VITAMIN B-12)) injection 1,000 mcg (has no administration in time range)  pantoprazole (PROTONIX) EC tablet 40 mg (has no administration in time range)  iohexol (OMNIPAQUE) 350 MG/ML injection 100 mL  (100 mLs Intravenous Contrast Given 08/27/19 [id number])  heparin bolus via infusion 4,000 Units (4,000 Units Intravenous Bolus from Bag 08/27/19 0738)    Vitals:   08/27/19 2200 08/27/19 2300 08/27/19 2335 08/28/19 0000  BP: (!) 162/92 135/71 (!) 155/99   Pulse: 82 90 88   Resp: 17 (!) 24 20   Temp:    97.9 F (36.6 C)  TempSrc:    Oral  SpO2: 96% 97% 98%   Weight:      Height:        Final diagnoses:  Acute pulmonary embolism, unspecified pulmonary embolism type, unspecified whether acute cor pulmonale present [facility])    Admission/ observation were discussed with the admitting physician, patient and/or family and they are comfortable with the plan.   Final Clinical Impression(s) / ED Diagnoses Final diagnoses:  Acute pulmonary embolism, unspecified pulmonary embolism type, unspecified whether acute cor pulmonale present [facility])    Rx / DC Orders ED Discharge Orders    None       Deno Etienne, DO 08/28/19 0107

## 2019-08-27 NOTE — ED Notes (Signed)
Pt to CT scan.

## 2019-08-28 ENCOUNTER — Inpatient Hospital Stay (HOSPITAL_COMMUNITY): Payer: Medicare HMO

## 2019-08-28 DIAGNOSIS — I2602 Saddle embolus of pulmonary artery with acute cor pulmonale: Secondary | ICD-10-CM

## 2019-08-28 DIAGNOSIS — Y92009 Unspecified place in unspecified non-institutional (private) residence as the place of occurrence of the external cause: Secondary | ICD-10-CM

## 2019-08-28 DIAGNOSIS — I2699 Other pulmonary embolism without acute cor pulmonale: Secondary | ICD-10-CM

## 2019-08-28 DIAGNOSIS — K219 Gastro-esophageal reflux disease without esophagitis: Secondary | ICD-10-CM | POA: Diagnosis present

## 2019-08-28 DIAGNOSIS — W19XXXA Unspecified fall, initial encounter: Secondary | ICD-10-CM

## 2019-08-28 LAB — COMPREHENSIVE METABOLIC PANEL
ALT: 15 U/L (ref 0–44)
AST: 22 U/L (ref 15–41)
Albumin: 3.5 g/dL (ref 3.5–5.0)
Alkaline Phosphatase: 70 U/L (ref 38–126)
Anion gap: 7 (ref 5–15)
BUN: 13 mg/dL (ref 8–23)
CO2: 28 mmol/L (ref 22–32)
Calcium: 8.8 mg/dL — ABNORMAL LOW (ref 8.9–10.3)
Chloride: 106 mmol/L (ref 98–111)
Creatinine, Ser: 0.89 mg/dL (ref 0.61–1.24)
GFR calc Af Amer: >60 mL/min (ref >60)
GFR calc non Af Amer: >60 mL/min (ref >60)
Glucose, Bld: 110 mg/dL — ABNORMAL HIGH (ref 70–99)
Potassium: 3.6 mmol/L (ref 3.5–5.1)
Sodium: 141 mmol/L (ref 135–145)
Total Bilirubin: 0.8 mg/dL (ref 0.3–1.2)
Total Protein: 6.2 g/dL — ABNORMAL LOW (ref 6.5–8.1)

## 2019-08-28 LAB — CBC
HCT: 42.8 % (ref 39.0–52.0)
Hemoglobin: 14.5 g/dL (ref 13.0–17.0)
MCH: 30.7 pg (ref 26.0–34.0)
MCHC: 33.9 g/dL (ref 30.0–36.0)
MCV: 90.7 fL (ref 80.0–100.0)
Platelets: 159 10*3/uL (ref 150–400)
RBC: 4.72 MIL/uL (ref 4.22–5.81)
RDW: 13.5 % (ref 11.5–15.5)
WBC: 7.3 10*3/uL (ref 4.0–10.5)
nRBC: 0 % (ref 0.0–0.2)

## 2019-08-28 LAB — ECHOCARDIOGRAM COMPLETE
Height: 72 in
Weight: 3985.92 oz

## 2019-08-28 LAB — HEPARIN LEVEL (UNFRACTIONATED)
Heparin Unfractionated: 0.47 IU/mL (ref 0.30–0.70)
Heparin Unfractionated: 0.16 IU/mL — ABNORMAL LOW (ref 0.30–0.70)
Heparin Unfractionated: 0.71 IU/mL — ABNORMAL HIGH (ref 0.30–0.70)

## 2019-08-28 MED ORDER — ZOLPIDEM TARTRATE 5 MG PO TABS
2.5000 mg | ORAL_TABLET | Freq: Every evening | ORAL | Status: DC | PRN
  Administered 2019-08-28 – 2019-09-03 (×4): 2.5 mg via ORAL
  Filled 2019-08-28 (×4): qty 1

## 2019-08-28 MED ORDER — HEPARIN (PORCINE) 25000 UT/250ML-% IV SOLN
1700.0000 [IU]/h | INTRAVENOUS | Status: DC
  Administered 2019-08-28: 14:00:00 1700 [IU]/h via INTRAVENOUS
  Filled 2019-08-28: qty 250

## 2019-08-28 MED ORDER — PANTOPRAZOLE SODIUM 40 MG PO TBEC
40.0000 mg | DELAYED_RELEASE_TABLET | Freq: Every day | ORAL | Status: DC
  Administered 2019-08-28 – 2019-09-04 (×8): 40 mg via ORAL
  Filled 2019-08-28 (×8): qty 1

## 2019-08-28 MED ORDER — HEPARIN (PORCINE) 25000 UT/250ML-% IV SOLN
1600.0000 [IU]/h | INTRAVENOUS | Status: DC
  Administered 2019-08-29 (×2): 1600 [IU]/h via INTRAVENOUS
  Filled 2019-08-28 (×3): qty 250

## 2019-08-28 MED ORDER — GABAPENTIN 300 MG PO CAPS
300.0000 mg | ORAL_CAPSULE | Freq: Two times a day (BID) | ORAL | Status: DC
  Administered 2019-08-28 – 2019-09-04 (×16): 300 mg via ORAL
  Filled 2019-08-28 (×16): qty 1

## 2019-08-28 MED ORDER — METOPROLOL TARTRATE 5 MG/5ML IV SOLN
2.5000 mg | Freq: Two times a day (BID) | INTRAVENOUS | Status: AC
  Administered 2019-08-28: 23:00:00 2.5 mg via INTRAVENOUS
  Filled 2019-08-28: qty 5

## 2019-08-28 MED ORDER — DULOXETINE HCL 60 MG PO CPEP
60.0000 mg | ORAL_CAPSULE | Freq: Every day | ORAL | Status: DC
  Administered 2019-08-28 – 2019-09-03 (×8): 60 mg via ORAL
  Filled 2019-08-28: qty 1
  Filled 2019-08-28: qty 2
  Filled 2019-08-28 (×3): qty 1
  Filled 2019-08-28: qty 2
  Filled 2019-08-28: qty 1
  Filled 2019-08-28: qty 2

## 2019-08-28 MED ORDER — VITAMIN D3 25 MCG (1000 UNIT) PO TABS
5000.0000 [IU] | ORAL_TABLET | Freq: Every day | ORAL | Status: DC
  Administered 2019-08-28 – 2019-09-04 (×8): 5000 [IU] via ORAL
  Filled 2019-08-28 (×8): qty 5

## 2019-08-28 MED ORDER — CYANOCOBALAMIN 1000 MCG/ML IJ SOLN: 1000.0000 ug | INTRAMUSCULAR | Status: DC

## 2019-08-28 MED ORDER — HEPARIN BOLUS VIA INFUSION
3000.0000 [IU] | Freq: Once | INTRAVENOUS | Status: AC
  Administered 2019-08-28: 11:00:00 3000 [IU] via INTRAVENOUS
  Filled 2019-08-28: qty 3000

## 2019-08-28 NOTE — Progress Notes (Signed)
Bilateral lower extremity venous duplex has been completed. Preliminary results can be found in CV Proc through chart review.  Results were given to the patient's nurse, [place].  08/28/19 10:52 AM Dustin Hines RVT

## 2019-08-28 NOTE — Progress Notes (Signed)
Pharmacy Brief  Note - Anticoagulation Follow Up:  Patient on heparin infusion for PE.   Assessment:  HL = 0.71 is slightly supratherapeutic on heparin infusion of 1700 units/hr  Confirmed with RN that heparin infusing at correct rate. No signs of bleeding. Pt has some bruising from his fall which has not worsened.   Goal: HL 0.3 - 0.7  Plan:  Decrease heparin infusion to 1600 units/hr  Check HL in 8 hours, CBC with AM labs tomorrow  Monitor for signs/symptoms of bleeding  Lenis Noon, PharmD 08/28/19 8:11 PM

## 2019-08-28 NOTE — Progress Notes (Signed)
  Echocardiogram 2D Echocardiogram has been performed.  Kaili Castille G Haille Pardi 08/28/2019, 3:03 PM

## 2019-08-28 NOTE — Progress Notes (Signed)
BLE venous US completed    CRITICAL VALUE ALERT  Critical Value: Left: Findings consistent with acute deep vein thrombosis involving the left femoral vein, and left popliteal vein.   Date & Time Notifed:  08/28/2019 1100  Provider Notified: MD Wyline Copas   Orders Received/Actions taken: No new orders received, patient remains on heparin gtt. Heparin gtt per pharmacy orders.

## 2019-08-28 NOTE — Progress Notes (Signed)
ANTICOAGULATION CONSULT NOTE - Follow Up Consult  Pharmacy Consult for Heparin Indication: pulmonary embolus  Allergies  Allergen Reactions  . Hydrocodone Rash    Mild rash one time, but has taken other agents with no reaction    Patient Measurements: Height: 6' (182.9 cm) Weight: 249 lb 1.9 oz (113 kg) IBW/kg (Calculated) : 77.6 Heparin Dosing Weight:   Vital Signs: Temp: 97.9 F (36.6 C) (01/18 0000) Temp Source: Oral (01/18 0000) BP: 155/99 (01/17 2335) Pulse Rate: 88 (01/17 2335)  Labs: Recent Labs    08/27/19 0522 08/27/19 1415 08/28/19 0018  HGB 14.4  --  14.5  HCT 42.8  --  42.8  PLT 159  --  159  HEPARINUNFRC  --  0.71* 0.47  CREATININE 0.97  --  0.89  TROPONINIHS 7  --   --     Estimated Creatinine Clearance: 75.9 mL/min (by C-G formula based on SCr of 0.89 mg/dL).   Medications:  Infusions:  . sodium chloride 75 mL/hr at 08/27/19 2345  . heparin 1,500 Units/hr (08/27/19 2133)    Assessment: Patient with heparin level at goal.  No heparin issues noted.  Goal of Therapy:  Heparin level 0.3-0.7 units/ml Monitor platelets by anticoagulation protocol: Yes   Plan:  Continue heparin drip at current rate Recheck level at 0900  Tyler Deis, Shea Stakes [place] 08/28/2019,3:34 AM

## 2019-08-28 NOTE — Progress Notes (Signed)
PROGRESS NOTE    Donelle Baba Sr.  ZOX:[id number] DOB: [birth year] DOA: 08/27/2019 PCP: Javier Glazier, MD    Brief Narrative:  86 y.o. male with medical history significant of peripheral neuropathy, history of prostate cancer, GERD, prior duodenal ulcer, osteoporosis and ventral hernia who has had previous falls last year with left-sided rib fractures.  Patient started feeling dizzy about 3 days ago.  He usually is active walking around the neighborhood.  Lately his activities have been decreased with the COVID-19.  Patient noted the dizziness but has not had any shortness of breath or cough.  Today however he fell and had lacerations mainly on his left upper and lower extremities as well as the rib cage where he had previous rib fractures.  Wife is a retired Marine scientist.  She came and helped him and dress his wounds.  She brought him to the ER for evaluation.  In the ER evaluation showed submassive pulmonary embolism.  Patient also mildly hypoxic.  He was seen at [facility] where he was initiated on IV heparin and sent over for further work-up.  He denied any fever or chills.  Denied any cough.  Denied any sick contacts.  COVID-19 screen done so far negative  Assessment & Plan:   Principal Problem:   Pulmonary embolism (HCC) Active Problems:   GERD (gastroesophageal reflux disease)   Fall at home, initial encounter  #1 submassive pulmonary embolism with  -CT reviewed. Findings of acute bilateral occlusive PE with severe clot burden -PESI score of 147 -Vital signs are stable, normotensive and normal HR and RR -2d echo ordered, pending results -LE dopplers reviewed. Finding of LLE DVT in L femoral vein and L popliteal vein -Continued on heparin gtt. Given clot burden, would continue heparin gtt x at least another 48hrs  #2 GERD: Initiate PPI especially in the setting of anticoagulation with history of duodenal ulcer.  #3 fall: Due to gait abnormalities.  Patient apparently  has had recurrent falls at home.  At this point he has multiple bruises.  PT OT.  Lives at home with the wife.  May be a candidate for short-term rehab afterwards.  DVT prophylaxis: Heparin gtt Code Status: Full Family Communication: Pt in room, family not at bedside Disposition Plan: Uncertain at this time  Consultants:     Procedures:     Antimicrobials: Anti-infectives (From admission, onward)   None       Subjective: Without complaints. Denies sob this AM  Objective: Vitals:   08/28/19 1500 08/28/19 1600 08/28/19 1610 08/28/19 1700  BP: 138/71 140/80  (!) 149/84  Pulse: (!) 102 (!) 101  94  Resp: (!) 22 (!) 21  18  Temp:   98.7 F (37.1 C)   TempSrc:   Oral   SpO2: 96% 95%  97%  Weight:      Height:        Intake/Output Summary (Last 24 hours) at 08/28/2019 1733 Last data filed at 08/28/2019 1600 Gross per 24 hour  Intake 2636.56 ml  Output 900 ml  Net 1736.56 ml   Filed Weights   08/27/19 0450 08/27/19 2100  Weight: 114.3 kg 113 kg    Examination:  General exam: Appears calm and comfortable  Respiratory system: Clear to auscultation. Respiratory effort normal. Cardiovascular system: S1 & S2 heard, Regular Gastrointestinal system: Abdomen is nondistended, soft and nontender. No organomegaly or masses felt. Normal bowel sounds heard. Central nervous system: Alert and oriented. No focal neurological deficits. Extremities: Symmetric 5  x 5 power. Skin: No rashes, lesions Psychiatry: Judgement and insight appear normal. Mood & affect appropriate.   Data Reviewed: I have personally reviewed following labs and imaging studies  CBC: Recent Labs  Lab 08/27/19 0522 08/28/19 0018  WBC 6.5 7.3  NEUTROABS 4.8  --   HGB 14.4 14.5  HCT 42.8 42.8  MCV 91.1 90.7  PLT 159 921   Basic Metabolic Panel: Recent Labs  Lab 08/27/19 0522 08/28/19 0018  NA 140 141  K 3.9 3.6  CL 106 106  CO2 26 28  GLUCOSE 126* 110*  BUN 16 13  CREATININE 0.97 0.89   CALCIUM 8.7* 8.8*   GFR: Estimated Creatinine Clearance: 75.9 mL/min (by C-G formula based on SCr of 0.89 mg/dL). Liver Function Tests: Recent Labs  Lab 08/28/19 0018  AST 22  ALT 15  ALKPHOS 70  BILITOT 0.8  PROT 6.2*  ALBUMIN 3.5   No results for input(s): LIPASE, AMYLASE in the last 168 hours. No results for input(s): AMMONIA in the last 168 hours. Coagulation Profile: No results for input(s): INR, PROTIME in the last 168 hours. Cardiac Enzymes: No results for input(s): CKTOTAL, CKMB, CKMBINDEX, TROPONINI in the last 168 hours. BNP (last 3 results) No results for input(s): PROBNP in the last 8760 hours. HbA1C: No results for input(s): HGBA1C in the last 72 hours. CBG: No results for input(s): GLUCAP in the last 168 hours. Lipid Profile: No results for input(s): CHOL, HDL, LDLCALC, TRIG, CHOLHDL, LDLDIRECT in the last 72 hours. Thyroid Function Tests: No results for input(s): TSH, T4TOTAL, FREET4, T3FREE, THYROIDAB in the last 72 hours. Anemia Panel: No results for input(s): VITAMINB12, FOLATE, FERRITIN, TIBC, IRON, RETICCTPCT in the last 72 hours. Sepsis Labs: No results for input(s): PROCALCITON, LATICACIDVEN in the last 168 hours.  Recent Results (from the past 240 hour(s))  SARS Coronavirus 2 Ag (30 min TAT) - Nasal Swab (BD Veritor Kit)     Status: None   Collection Time: 08/27/19  5:22 AM   Specimen: Nasal Swab (BD Veritor Kit)  Result Value Ref Range Status   SARS Coronavirus 2 Ag NEGATIVE NEGATIVE Final    Comment: (NOTE) SARS-CoV-2 antigen NOT DETECTED.  Negative results are presumptive.  Negative results do not preclude SARS-CoV-2 infection and should not be used as the sole basis for treatment or other patient management decisions, including infection  control decisions, particularly in the presence of clinical signs and  symptoms consistent with COVID-19, or in those who have been in contact with the virus.  Negative results must be combined  with clinical observations, patient history, and epidemiological information. The expected result is Negative. Fact Sheet for Patients: PodPark.tn Fact Sheet for Healthcare Providers: GiftContent.is This test is not yet approved or cleared by the [place] FDA and  has been authorized for detection and/or diagnosis of SARS-CoV-2 by FDA under an Emergency Use Authorization (EUA).  This EUA will remain in effect (meaning this test can be used) for the duration of  the COVID-19 de claration under Section 564(b)(1) of the Act, 21 U.S.C. section 360bbb-3(b)(1), unless the authorization is terminated or revoked sooner. Performed at [facility], [address] [place], [place] [address]   SARS CORONAVIRUS 2 (TAT 6-24 HRS) Nasopharyngeal Nasopharyngeal Swab     Status: None   Collection Time: 08/27/19  8:00 AM   Specimen: Nasopharyngeal Swab  Result Value Ref Range Status   SARS Coronavirus 2 NEGATIVE NEGATIVE Final    Comment: (NOTE) SARS-CoV-2  target nucleic acids are NOT DETECTED. The SARS-CoV-2 RNA is generally detectable in upper and lower respiratory specimens during the acute phase of infection. Negative results do not preclude SARS-CoV-2 infection, do not rule out co-infections with other pathogens, and should not be used as the sole basis for treatment or other patient management decisions. Negative results must be combined with clinical observations, patient history, and epidemiological information. The expected result is Negative. Fact Sheet for Patients: SugarRoll.be Fact Sheet for Healthcare Providers: https://www.woods-mathews.com/ This test is not yet approved or cleared by the [place] FDA and  has been authorized for detection and/or diagnosis of SARS-CoV-2 by FDA under an Emergency Use Authorization (EUA). This EUA will remain  in effect (meaning this  test can be used) for the duration of the COVID-19 declaration under Section 56 4(b)(1) of the Act, 21 U.S.C. section 360bbb-3(b)(1), unless the authorization is terminated or revoked sooner. Performed at [facility], [place] [address], [place], [place] [address]      Radiology Studies: CT Angio Chest PE W and/or Wo Contrast  Result Date: 08/27/2019 CLINICAL DATA:  Dizziness, fall, rib pain common abdominal trauma EXAM: CT ANGIOGRAPHY CHEST CT ABDOMEN AND PELVIS WITH CONTRAST TECHNIQUE: Multidetector CT imaging of the chest was performed using the standard protocol during bolus administration of intravenous contrast. Multiplanar CT image reconstructions and MIPs were obtained to evaluate the vascular anatomy. Multidetector CT imaging of the abdomen and pelvis was performed using the standard protocol during bolus administration of intravenous contrast. CONTRAST:  148m OMNIPAQUE IOHEXOL 350 MG/ML SOLN COMPARISON:  CT 01/18/2018 FINDINGS: CTA CHEST FINDINGS Cardiovascular: Large filling defect within the distal RIGHT main pulmonary artery. Findings consistent with acute thromboembolism. This embolism extends into the lower lobe pulmonary arteries where there is occlusion of the posterior segments of the RIGHT lower lobe pulmonary arteries. Occlusive thrombus extends into the RIGHT upper lobe pulmonary are which are occlusive. Filling defect defect within the LEFT lower lobe pulmonary artery which is partially occlusive. Upper lobe branch occlusion also noted (image 80/6). Overall clot burden is severe. There is evidence of RIGHT ventricular strain with the RIGHT ventricular diameter to LEFT ventricular diameter ratio equal 1.1 (3.7: 3.5 cm) Mediastinum/Nodes: Trachea and esophagus normal.  No lymphadenopathy Lungs/Pleura: No pulmonary infarction. Subpleural reticulation throughout the lungs. No pleural fluid. No pneumothorax. There is a blood within the posterior aspect of the superior segment LEFT  lower lobe. Musculoskeletal: No acute osseous abnormality. Review of the MIP images confirms the above findings. CT ABDOMEN and PELVIS FINDINGS Hepatobiliary: No focal hepatic lesion. Postcholecystectomy. No biliary dilatation. Pancreas: Pancreas is normal. No ductal dilatation. No pancreatic inflammation. Spleen: Normal spleen Adrenals/urinary tract: Adrenal glands and kidneys are normal. The ureters and bladder normal. Stomach/Bowel: Stomach, small bowel, appendix, and cecum are normal. The colon and rectosigmoid colon are normal. Vascular/Lymphatic: Abdominal aorta is normal caliber with atherosclerotic calcification. There is no retroperitoneal or periportal lymphadenopathy. No pelvic lymphadenopathy. Reproductive: Post prostatectomy Other: No free fluid. Musculoskeletal: Degenerative osteophytosis of the spine. Review of the MIP images confirms the above findings. IMPRESSION: 1. Acute bilateral occlusive pulmonary thromboemboli. Overall clot burden is severe. 2. CT evidence of right heart strain (RV/LV Ratio = 1.1) consistent with at least submassive (intermediate risk) PE. The presence of right heart strain has been associated with an increased risk of morbidity and mortality. 3. No pulmonary infarction.  No airspace disease 4. No acute findings in the abdomen pelvis. No evidence of abdominal trauma. 5. Aortic Atherosclerosis (ICD10-I70.0).  Critical Value/emergent results were called by telephone at the time of interpretation on 08/27/2019 at 7:07 am to [place] , who verbally acknowledged these results. Electronically Signed   By: Suzy Bouchard M.D.   On: 08/27/2019 07:11   CT ABDOMEN PELVIS W CONTRAST  Result Date: 08/27/2019 CLINICAL DATA:  Dizziness, fall, rib pain common abdominal trauma EXAM: CT ANGIOGRAPHY CHEST CT ABDOMEN AND PELVIS WITH CONTRAST TECHNIQUE: Multidetector CT imaging of the chest was performed using the standard protocol during bolus administration of intravenous contrast.  Multiplanar CT image reconstructions and MIPs were obtained to evaluate the vascular anatomy. Multidetector CT imaging of the abdomen and pelvis was performed using the standard protocol during bolus administration of intravenous contrast. CONTRAST:  145m OMNIPAQUE IOHEXOL 350 MG/ML SOLN COMPARISON:  CT 01/18/2018 FINDINGS: CTA CHEST FINDINGS Cardiovascular: Large filling defect within the distal RIGHT main pulmonary artery. Findings consistent with acute thromboembolism. This embolism extends into the lower lobe pulmonary arteries where there is occlusion of the posterior segments of the RIGHT lower lobe pulmonary arteries. Occlusive thrombus extends into the RIGHT upper lobe pulmonary are which are occlusive. Filling defect defect within the LEFT lower lobe pulmonary artery which is partially occlusive. Upper lobe branch occlusion also noted (image 80/6). Overall clot burden is severe. There is evidence of RIGHT ventricular strain with the RIGHT ventricular diameter to LEFT ventricular diameter ratio equal 1.1 (3.7: 3.5 cm) Mediastinum/Nodes: Trachea and esophagus normal.  No lymphadenopathy Lungs/Pleura: No pulmonary infarction. Subpleural reticulation throughout the lungs. No pleural fluid. No pneumothorax. There is a blood within the posterior aspect of the superior segment LEFT lower lobe. Musculoskeletal: No acute osseous abnormality. Review of the MIP images confirms the above findings. CT ABDOMEN and PELVIS FINDINGS Hepatobiliary: No focal hepatic lesion. Postcholecystectomy. No biliary dilatation. Pancreas: Pancreas is normal. No ductal dilatation. No pancreatic inflammation. Spleen: Normal spleen Adrenals/urinary tract: Adrenal glands and kidneys are normal. The ureters and bladder normal. Stomach/Bowel: Stomach, small bowel, appendix, and cecum are normal. The colon and rectosigmoid colon are normal. Vascular/Lymphatic: Abdominal aorta is normal caliber with atherosclerotic calcification. There is no  retroperitoneal or periportal lymphadenopathy. No pelvic lymphadenopathy. Reproductive: Post prostatectomy Other: No free fluid. Musculoskeletal: Degenerative osteophytosis of the spine. Review of the MIP images confirms the above findings. IMPRESSION: 1. Acute bilateral occlusive pulmonary thromboemboli. Overall clot burden is severe. 2. CT evidence of right heart strain (RV/LV Ratio = 1.1) consistent with at least submassive (intermediate risk) PE. The presence of right heart strain has been associated with an increased risk of morbidity and mortality. 3. No pulmonary infarction.  No airspace disease 4. No acute findings in the abdomen pelvis. No evidence of abdominal trauma. 5. Aortic Atherosclerosis (ICD10-I70.0). Critical Value/emergent results were called by telephone at the time of interpretation on 08/27/2019 at 7:07 am to [place], who verbally acknowledged these results. Electronically Signed   By: SSuzy BouchardM.D.   On: 08/27/2019 07:11   DG Chest Port 1 View  Result Date: 08/27/2019 CLINICAL DATA:  Lt rib pain s/p fall, s/p dizziness w/ nANDv, low O2 sats, mult old lt rib fxs//acleft chest wall pain EXAM: PORTABLE CHEST 1 VIEW COMPARISON:  Radiograph 01/18/2018, CT 01/18/2018 FINDINGS: Enlarged cardiac silhouette. Low lung volumes. Prominent vascular densities in the upper lobes. No change from prior. Mild peripheral reticulation similar to prior. IMPRESSION: 1. No change from comparison exam. 2. Cardiomegaly and mild interstitial lung disease Electronically Signed   By: SSuzy BouchardM.D.   On: 08/27/2019  06:12   ECHOCARDIOGRAM COMPLETE  Result Date: 08/28/2019   ECHOCARDIOGRAM REPORT   Patient Name:   Roosvelt Churchwell Sr. Date of Exam: 08/28/2019 Medical Rec #:  [id number]           Height:       72.0 in Accession #:    [id number]          Weight:       249.1 lb Date of Birth:  04-Apr-1930          BSA:          2.34 m Patient Age:    65 years            BP:           139/73 mmHg  Patient Gender: M                   HR:           103 bpm. Exam Location:  Inpatient Procedure: 2D Echo, Cardiac Doppler and Color Doppler Indications:    I26.02 Pulmonary embolus  History:        Patient has prior history of Echocardiogram examinations, most                 recent 01/22/2018. Risk Factors:GERD. Cancer.  Sonographer:    Jonelle Sidle Dance Referring Phys: [place]  1. Left ventricular ejection fraction, by visual estimation, is 60 to 65%. The left ventricle has normal function. There is no left ventricular hypertrophy.  2. Left ventricular diastolic parameters are consistent with Grade I diastolic dysfunction (impaired relaxation).  3. The left ventricle has no regional wall motion abnormalities.  4. Global right ventricle has normal systolic function.The right ventricular size is normal. No increase in right ventricular wall thickness.  5. Left atrial size was normal.  6. Right atrial size was normal.  7. Mild mitral annular calcification.  8. The mitral valve is normal in structure. No evidence of mitral valve regurgitation. No evidence of mitral stenosis.  9. The tricuspid valve is normal in structure. Tricuspid valve regurgitation is trivial. 10. The aortic valve is normal in structure. Aortic valve regurgitation is trivial. Mild aortic valve sclerosis without stenosis. 11. There is mild dilatation of the aortic root measuring 41 mm. 12. The inferior vena cava is normal in size with greater than 50% respiratory variability, suggesting right atrial pressure of 3 mmHg. 13. The tricuspid regurgitant velocity is 2.46 m/s, and with an assumed right atrial pressure of 3 mmHg, the estimated right ventricular systolic pressure is normal at 27.2 mmHg. 14. Trivial pericardial effusion is present. FINDINGS  Left Ventricle: Left ventricular ejection fraction, by visual estimation, is 60 to 65%. The left ventricle has normal function. The left ventricle has no regional wall motion  abnormalities. The left ventricular internal cavity size was the left ventricle is normal in size. There is no left ventricular hypertrophy. Left ventricular diastolic parameters are consistent with Grade I diastolic dysfunction (impaired relaxation). Right Ventricle: The right ventricular size is normal. No increase in right ventricular wall thickness. Global RV systolic function is has normal systolic function. The tricuspid regurgitant velocity is 2.46 m/s, and with an assumed right atrial pressure  of 3 mmHg, the estimated right ventricular systolic pressure is normal at 27.2 mmHg. Left Atrium: Left atrial size was normal in size. Right Atrium: Right atrial size was normal in size Pericardium: Trivial pericardial effusion is present. Mitral Valve: The mitral valve is normal in  structure. There is mild calcification of the mitral valve leaflet(s). Mild mitral annular calcification. No evidence of mitral valve regurgitation. No evidence of mitral valve stenosis by observation. Tricuspid Valve: The tricuspid valve is normal in structure. Tricuspid valve regurgitation is trivial. Aortic Valve: The aortic valve is normal in structure. Aortic valve regurgitation is trivial. Mild aortic valve sclerosis is present, with no evidence of aortic valve stenosis. Pulmonic Valve: The pulmonic valve was normal in structure. Pulmonic valve regurgitation is not visualized. Pulmonic regurgitation is not visualized. Aorta: Aortic dilatation noted. There is mild dilatation of the aortic root measuring 41 mm. Venous: The inferior vena cava is normal in size with greater than 50% respiratory variability, suggesting right atrial pressure of 3 mmHg. IAS/Shunts: No atrial level shunt detected by color flow Doppler.  LEFT VENTRICLE PLAX 2D LVIDd:         4.70 cm LVIDs:         2.70 cm LV PW:         1.10 cm LV IVS:        1.20 cm LVOT diam:     2.20 cm LV SV:         75 ml LV SV Index:   30.99 LVOT Area:     3.80 cm  RIGHT VENTRICLE              IVC RV Basal diam:  2.30 cm     IVC diam: 1.70 cm RV S prime:     19.40 cm/s TAPSE (M-mode): 1.7 cm LEFT ATRIUM             Index       RIGHT ATRIUM           Index LA diam:        4.80 cm 2.05 cm/m  RA Area:     13.40 cm LA Vol (A2C):   72.6 ml 31.04 ml/m RA Volume:   26.70 ml  11.42 ml/m LA Vol (A4C):   58.1 ml 24.84 ml/m LA Biplane Vol: 66.1 ml 28.26 ml/m  AORTIC VALVE LVOT Vmax:   117.00 cm/s LVOT Vmean:  78.400 cm/s LVOT VTI:    0.221 m  AORTA Ao Root diam: 4.60 cm Ao Asc diam:  3.70 cm MV A velocity: 126.00 cm/s 70.3 cm/s TRICUSPID VALVE                                      TR Peak grad:   24.2 mmHg                                      TR Vmax:        246.00 cm/s                                       SHUNTS                                      Systemic VTI:  0.22 m  Systemic Diam: 2.20 cm  Loralie Champagne MD Electronically signed by Loralie Champagne MD Signature Date/Time: 08/28/2019/3:30:44 PM    Final    VAS [place] LOWER EXTREMITY VENOUS (DVT)  Result Date: 08/28/2019  Lower Venous Study Indications: Pulmonary embolism.  Risk Factors: Confirmed PE. Anticoagulation: Heparin. Limitations: Body habitus, poor ultrasound/tissue interface, patient positioning, open wound and bandages. Comparison Study: No prior studies. Performing Technologist: Oliver Hum RVT  Examination Guidelines: A complete evaluation includes B-mode imaging, spectral Doppler, color Doppler, and power Doppler as needed of all accessible portions of each vessel. Bilateral testing is considered an integral part of a complete examination. Limited examinations for reoccurring indications may be performed as noted.  +---------+---------------+---------+-----------+----------+--------------+ RIGHT    CompressibilityPhasicitySpontaneityPropertiesThrombus Aging +---------+---------------+---------+-----------+----------+--------------+ CFV      Full           Yes      Yes                                  +---------+---------------+---------+-----------+----------+--------------+ SFJ      Full                                                        +---------+---------------+---------+-----------+----------+--------------+ FV Prox  Full                                                        +---------+---------------+---------+-----------+----------+--------------+ FV Mid   Full                                                        +---------+---------------+---------+-----------+----------+--------------+ FV DistalFull                                                        +---------+---------------+---------+-----------+----------+--------------+ PFV      Full                                                        +---------+---------------+---------+-----------+----------+--------------+ POP      Full           Yes      Yes                                 +---------+---------------+---------+-----------+----------+--------------+ PTV      Full                                                        +---------+---------------+---------+-----------+----------+--------------+  PERO     Full                                                        +---------+---------------+---------+-----------+----------+--------------+   +---------+---------------+---------+-----------+----------+--------------+ LEFT     CompressibilityPhasicitySpontaneityPropertiesThrombus Aging +---------+---------------+---------+-----------+----------+--------------+ CFV      Full           Yes      Yes                                 +---------+---------------+---------+-----------+----------+--------------+ SFJ      Full                                                        +---------+---------------+---------+-----------+----------+--------------+ FV Prox  Full                                                         +---------+---------------+---------+-----------+----------+--------------+ FV Mid   Full                                                        +---------+---------------+---------+-----------+----------+--------------+ FV DistalNone           No       No                   Acute          +---------+---------------+---------+-----------+----------+--------------+ PFV      Full                                                        +---------+---------------+---------+-----------+----------+--------------+ POP      Partial        Yes      Yes                  Acute          +---------+---------------+---------+-----------+----------+--------------+ PTV      Full                                                        +---------+---------------+---------+-----------+----------+--------------+ PERO                                                  Not visualized +---------+---------------+---------+-----------+----------+--------------+     Summary: Right: There is  no evidence of deep vein thrombosis in the lower extremity. No cystic structure found in the popliteal fossa. Left: Findings consistent with acute deep vein thrombosis involving the left femoral vein, and left popliteal vein. No cystic structure found in the popliteal fossa.  *See table(s) above for measurements and observations. Electronically signed by Ruta Hinds MD on 08/28/2019 at 2:51:08 PM.    Final     Scheduled Meds: . Chlorhexidine Gluconate Cloth  6 each Topical Daily  . cholecalciferol  5,000 Units Oral Daily  . [START ON 09/15/2019] cyanocobalamin  1,000 mcg Intramuscular Q30 days  . DULoxetine  60 mg Oral QHS  . gabapentin  300 mg Oral BID  . mouth rinse  15 mL Mouth Rinse BID  . pantoprazole  40 mg Oral Daily   Continuous Infusions: . sodium chloride 75 mL/hr at 08/28/19 1600  . heparin 1,700 Units/hr (08/28/19 1600)     LOS: 1 day   Marylu Lund, MD [facility] Pager On  Amion  If 7PM-7AM, please contact night-coverage 08/28/2019, 5:33 PM

## 2019-08-28 NOTE — Plan of Care (Signed)
  Problem: Education: Goal: Knowledge of General Education information will improve Description Including pain rating scale, medication(s)/side effects and non-pharmacologic comfort measures Outcome: Progressing   

## 2019-08-28 NOTE — Progress Notes (Signed)
[place] for Heparin Indication: pulmonary embolus  Allergies  Allergen Reactions  . Hydrocodone Rash    Mild rash one time, but has taken other agents with no reaction    Patient Measurements: Height: 6' (182.9 cm) Weight: 249 lb 1.9 oz (113 kg) IBW/kg (Calculated) : 77.6 Heparin Dosing Weight: 100 kg  Vital Signs: Temp: 98.1 F (36.7 C) (01/18 0745) Temp Source: Oral (01/18 0745) BP: 137/91 (01/18 0800) Pulse Rate: 90 (01/18 0800)  Labs: Recent Labs    08/27/19 0522 08/27/19 1415 08/28/19 0018 08/28/19 0843  HGB 14.4  --  14.5  --   HCT 42.8  --  42.8  --   PLT 159  --  159  --   HEPARINUNFRC  --  0.71* 0.47 0.16*  CREATININE 0.97  --  0.89  --   TROPONINIHS 7  --   --   --     Estimated Creatinine Clearance: 75.9 mL/min (by C-G formula based on SCr of 0.89 mg/dL).     Assessment: 86 y.o. male  presented to ED with left sided chest pain. He was found to have bilateral PEs with severe clot burden. Pharmacy consulted to dose heparin for PE. 08/28/2019  Confirmatory heparin level is below goal at 0.16 on 1500 units/hr. Hg stable at 14.4, PLTC Stable at 159.   No bleeding reported Per RN heparin infusing well with no issues  Goal of Therapy:  Heparin level 0.3-0.7 units/ml Monitor platelets by anticoagulation protocol: Yes   Plan:  - heparin bolus 3000 units, increase drip to 1700 - Check heparin level in 8 hours  - daily heparin level and CBC  - Monitor patient for s/s of bleeding and CBC   Eudelia Bunch, Pharm.D [phone] 08/28/2019 9:51 AM

## 2019-08-29 LAB — HEPARIN LEVEL (UNFRACTIONATED): Heparin Unfractionated: 0.64 IU/mL (ref 0.30–0.70)

## 2019-08-29 LAB — CBC
HCT: 41.9 % (ref 39.0–52.0)
Hemoglobin: 14.1 g/dL (ref 13.0–17.0)
MCH: 30.4 pg (ref 26.0–34.0)
MCHC: 33.7 g/dL (ref 30.0–36.0)
MCV: 90.3 fL (ref 80.0–100.0)
Platelets: 148 10*3/uL — ABNORMAL LOW (ref 150–400)
RBC: 4.64 MIL/uL (ref 4.22–5.81)
RDW: 13.7 % (ref 11.5–15.5)
WBC: 5 10*3/uL (ref 4.0–10.5)
nRBC: 0 % (ref 0.0–0.2)

## 2019-08-29 MED ORDER — METOPROLOL TARTRATE 5 MG/5ML IV SOLN
2.5000 mg | Freq: Two times a day (BID) | INTRAVENOUS | Status: AC
  Administered 2019-08-29: 2.5 mg via INTRAVENOUS
  Filled 2019-08-29: qty 5

## 2019-08-29 NOTE — Progress Notes (Signed)
[place] for Heparin Indication: pulmonary embolus  Allergies  Allergen Reactions  . Hydrocodone Rash    Mild rash one time, but has taken other agents with no reaction    Patient Measurements: Height: 6' (182.9 cm) Weight: 249 lb 1.9 oz (113 kg) IBW/kg (Calculated) : 77.6 Heparin Dosing Weight: 100 kg  Vital Signs: Temp: 98.4 F (36.9 C) (01/19 0800) Temp Source: Oral (01/19 0800) BP: 171/97 (01/19 0540) Pulse Rate: 87 (01/19 0410)  Labs: Recent Labs    08/27/19 0522 08/27/19 1415 08/28/19 0018 08/28/19 0018 08/28/19 0843 08/28/19 1906 08/29/19 0406  HGB 14.4  --  14.5  --   --   --  14.1  HCT 42.8  --  42.8  --   --   --  41.9  PLT 159  --  159  --   --   --  148*  HEPARINUNFRC  --    < > 0.47   < > 0.16* 0.71* 0.64  CREATININE 0.97  --  0.89  --   --   --   --   TROPONINIHS 7  --   --   --   --   --   --    < > = values in this interval not displayed.    Estimated Creatinine Clearance: 75.9 mL/min (by C-G formula based on SCr of 0.89 mg/dL).     Assessment: 86 y.o. male  presented to ED with left sided chest pain. He was found to have bilateral PEs with severe clot burden. Pharmacy consulted to dose heparin for PE. LLE DVT per doppler.    08/29/2019  Heparin level is therapeutic at 0.64 after rate decreased to 1600 units/hr. Hg stable at 14.1, PLT stable but low at 148   No bleeding reported  Goal of Therapy:  Heparin level 0.3-0.7 units/ml Monitor platelets by anticoagulation protocol: Yes   Plan:  - continue heparin at 1600 units/hr  - daily heparin level and CBC  - Monitor patient for s/s of bleeding and CBC  - f/u transition to oral anticoagulation  Eudelia Bunch, Pharm.D [phone] 08/29/2019 10:15 AM

## 2019-08-29 NOTE — Progress Notes (Signed)
Patient's BP 177/110 Map 133, patient complaining of headache 7/10., tylenol PO given per current orders. Patient is on Heparin, and had a previous fall, no signs or symptoms of stroke. MD paged for further orders.  Manual BP obtained per MD request, 164/100. MD placed order for 2.5mg  IV Lopressor. Medication given as ordered.  Patient's BP 135/84, HR 88, patient comfortable, reports resolution of headache.

## 2019-08-29 NOTE — Progress Notes (Signed)
PROGRESS NOTE    Dustin Smeltz Sr.  HYI:[id number] DOB: 04-27-1930 DOA: 08/27/2019 PCP: Javier Glazier, MD    Brief Narrative:  86 y.o. male with medical history significant of peripheral neuropathy, history of prostate cancer, GERD, prior duodenal ulcer, osteoporosis and ventral hernia who has had previous falls last year with left-sided rib fractures.  Patient started feeling dizzy about 3 days ago.  He usually is active walking around the neighborhood.  Lately his activities have been decreased with the COVID-19.  Patient noted the dizziness but has not had any shortness of breath or cough.  Today however he fell and had lacerations mainly on his left upper and lower extremities as well as the rib cage where he had previous rib fractures.  Wife is a retired Marine scientist.  She came and helped him and dress his wounds.  She brought him to the ER for evaluation.  In the ER evaluation showed submassive pulmonary embolism.  Patient also mildly hypoxic.  He was seen at [facility] where he was initiated on IV heparin and sent over for further work-up.  He denied any fever or chills.  Denied any cough.  Denied any sick contacts.  COVID-19 screen done so far negative  Assessment & Plan:   Principal Problem:   Pulmonary embolism (HCC) Active Problems:   GERD (gastroesophageal reflux disease)   Fall at home, initial encounter  #1 submassive pulmonary embolism with  -CT reviewed. Findings of acute bilateral occlusive PE with severe clot burden -PESI score of 147 -Vital signs are stable, normotensive and normal HR and RR -2d echo ordered, pending results -LE dopplers reviewed. Finding of LLE DVT in L femoral vein and L popliteal vein -Continued on heparin gtt. Given clot burden, would continue heparin gtt x another 24hrs, then consider transition to oral anticoagulation  #2 GERD:Cont PPI especially in the setting of anticoagulation with history of duodenal ulcer.  #3 fall: Due to gait  abnormalities.  Patient apparently has had recurrent falls at home.  At this point he has multiple bruises.  PT OT now consulted.  Lives at home with the wife.   DVT prophylaxis: Heparin gtt Code Status: Full Family Communication: Pt in room, family not at bedside Disposition Plan: Uncertain at this time, pending PT eval  Consultants:     Procedures:     Antimicrobials: Anti-infectives (From admission, onward)   None      Subjective: No complaints this AM. Denies sob  Objective: Vitals:   08/29/19 1000 08/29/19 1100 08/29/19 1200 08/29/19 1300  BP: (!) 153/87     Pulse: 81 88 81 86  Resp: 17 (!) '22 20 17  '$ Temp:      TempSrc:      SpO2: 99% 99% 95% 97%  Weight:      Height:        Intake/Output Summary (Last 24 hours) at 08/29/2019 1457 Last data filed at 08/29/2019 0600 Gross per 24 hour  Intake 1934.98 ml  Output 1000 ml  Net 934.98 ml   Filed Weights   08/27/19 0450 08/27/19 2100  Weight: 114.3 kg 113 kg    Examination: General exam: Awake, laying in bed, in nad Respiratory system: Normal respiratory effort, no wheezing Cardiovascular system: regular rate, s1, s2 Gastrointestinal system: Soft, nondistended, positive BS Central nervous system: CN2-12 grossly intact, strength intact Extremities: Perfused, no clubbing Skin: Normal skin turgor, no notable skin lesions seen Psychiatry: Mood normal // no visual hallucinations   Data Reviewed: I  have personally reviewed following labs and imaging studies  CBC: Recent Labs  Lab 08/27/19 0522 08/28/19 0018 08/29/19 0406  WBC 6.5 7.3 5.0  NEUTROABS 4.8  --   --   HGB 14.4 14.5 14.1  HCT 42.8 42.8 41.9  MCV 91.1 90.7 90.3  PLT 159 159 811*   Basic Metabolic Panel: Recent Labs  Lab 08/27/19 0522 08/28/19 0018  NA 140 141  K 3.9 3.6  CL 106 106  CO2 26 28  GLUCOSE 126* 110*  BUN 16 13  CREATININE 0.97 0.89  CALCIUM 8.7* 8.8*   GFR: Estimated Creatinine Clearance: 75.9 mL/min (by C-G formula  based on SCr of 0.89 mg/dL). Liver Function Tests: Recent Labs  Lab 08/28/19 0018  AST 22  ALT 15  ALKPHOS 70  BILITOT 0.8  PROT 6.2*  ALBUMIN 3.5   No results for input(s): LIPASE, AMYLASE in the last 168 hours. No results for input(s): AMMONIA in the last 168 hours. Coagulation Profile: No results for input(s): INR, PROTIME in the last 168 hours. Cardiac Enzymes: No results for input(s): CKTOTAL, CKMB, CKMBINDEX, TROPONINI in the last 168 hours. BNP (last 3 results) No results for input(s): PROBNP in the last 8760 hours. HbA1C: No results for input(s): HGBA1C in the last 72 hours. CBG: No results for input(s): GLUCAP in the last 168 hours. Lipid Profile: No results for input(s): CHOL, HDL, LDLCALC, TRIG, CHOLHDL, LDLDIRECT in the last 72 hours. Thyroid Function Tests: No results for input(s): TSH, T4TOTAL, FREET4, T3FREE, THYROIDAB in the last 72 hours. Anemia Panel: No results for input(s): VITAMINB12, FOLATE, FERRITIN, TIBC, IRON, RETICCTPCT in the last 72 hours. Sepsis Labs: No results for input(s): PROCALCITON, LATICACIDVEN in the last 168 hours.  Recent Results (from the past 240 hour(s))  SARS Coronavirus 2 Ag (30 min TAT) - Nasal Swab (BD Veritor Kit)     Status: None   Collection Time: 08/27/19  5:22 AM   Specimen: Nasal Swab (BD Veritor Kit)  Result Value Ref Range Status   SARS Coronavirus 2 Ag NEGATIVE NEGATIVE Final    Comment: (NOTE) SARS-CoV-2 antigen NOT DETECTED.  Negative results are presumptive.  Negative results do not preclude SARS-CoV-2 infection and should not be used as the sole basis for treatment or other patient management decisions, including infection  control decisions, particularly in the presence of clinical signs and  symptoms consistent with COVID-19, or in those who have been in contact with the virus.  Negative results must be combined with clinical observations, patient history, and epidemiological information. The expected result  is Negative. Fact Sheet for Patients: PodPark.tn Fact Sheet for Healthcare Providers: GiftContent.is This test is not yet approved or cleared by the [place] FDA and  has been authorized for detection and/or diagnosis of SARS-CoV-2 by FDA under an Emergency Use Authorization (EUA).  This EUA will remain in effect (meaning this test can be used) for the duration of  the COVID-19 de claration under Section 564(b)(1) of the Act, 21 U.S.C. section 360bbb-3(b)(1), unless the authorization is terminated or revoked sooner. Performed at [facility], [address] [place], [place] [address]   SARS CORONAVIRUS 2 (TAT 6-24 HRS) Nasopharyngeal Nasopharyngeal Swab     Status: None   Collection Time: 08/27/19  8:00 AM   Specimen: Nasopharyngeal Swab  Result Value Ref Range Status   SARS Coronavirus 2 NEGATIVE NEGATIVE Final    Comment: (NOTE) SARS-CoV-2 target nucleic acids are NOT DETECTED. The SARS-CoV-2 RNA is generally detectable  in upper and lower respiratory specimens during the acute phase of infection. Negative results do not preclude SARS-CoV-2 infection, do not rule out co-infections with other pathogens, and should not be used as the sole basis for treatment or other patient management decisions. Negative results must be combined with clinical observations, patient history, and epidemiological information. The expected result is Negative. Fact Sheet for Patients: SugarRoll.be Fact Sheet for Healthcare Providers: https://www.woods-mathews.com/ This test is not yet approved or cleared by the [place] FDA and  has been authorized for detection and/or diagnosis of SARS-CoV-2 by FDA under an Emergency Use Authorization (EUA). This EUA will remain  in effect (meaning this test can be used) for the duration of the COVID-19 declaration under Section 56 4(b)(1) of the  Act, 21 U.S.C. section 360bbb-3(b)(1), unless the authorization is terminated or revoked sooner. Performed at [facility], [place] [address], [place], [place] [address]      Radiology Studies: ECHOCARDIOGRAM COMPLETE  Result Date: 08/28/2019   ECHOCARDIOGRAM REPORT   Patient Name:   Marlene Pfluger Sr. Date of Exam: 08/28/2019 Medical Rec #:  [id number]           Height:       72.0 in Accession #:    [id number]          Weight:       249.1 lb Date of Birth:  1929/10/11          BSA:          2.34 m Patient Age:    14 years            BP:           139/73 mmHg Patient Gender: M                   HR:           103 bpm. Exam Location:  Inpatient Procedure: 2D Echo, Cardiac Doppler and Color Doppler Indications:    I26.02 Pulmonary embolus  History:        Patient has prior history of Echocardiogram examinations, most                 recent 01/22/2018. Risk Factors:GERD. Cancer.  Sonographer:    Jonelle Sidle Dance Referring Phys: [place]  1. Left ventricular ejection fraction, by visual estimation, is 60 to 65%. The left ventricle has normal function. There is no left ventricular hypertrophy.  2. Left ventricular diastolic parameters are consistent with Grade I diastolic dysfunction (impaired relaxation).  3. The left ventricle has no regional wall motion abnormalities.  4. Global right ventricle has normal systolic function.The right ventricular size is normal. No increase in right ventricular wall thickness.  5. Left atrial size was normal.  6. Right atrial size was normal.  7. Mild mitral annular calcification.  8. The mitral valve is normal in structure. No evidence of mitral valve regurgitation. No evidence of mitral stenosis.  9. The tricuspid valve is normal in structure. Tricuspid valve regurgitation is trivial. 10. The aortic valve is normal in structure. Aortic valve regurgitation is trivial. Mild aortic valve sclerosis without stenosis. 11. There is mild dilatation of the  aortic root measuring 41 mm. 12. The inferior vena cava is normal in size with greater than 50% respiratory variability, suggesting right atrial pressure of 3 mmHg. 13. The tricuspid regurgitant velocity is 2.46 m/s, and with an assumed right atrial pressure of 3 mmHg, the estimated right ventricular systolic  pressure is normal at 27.2 mmHg. 14. Trivial pericardial effusion is present. FINDINGS  Left Ventricle: Left ventricular ejection fraction, by visual estimation, is 60 to 65%. The left ventricle has normal function. The left ventricle has no regional wall motion abnormalities. The left ventricular internal cavity size was the left ventricle is normal in size. There is no left ventricular hypertrophy. Left ventricular diastolic parameters are consistent with Grade I diastolic dysfunction (impaired relaxation). Right Ventricle: The right ventricular size is normal. No increase in right ventricular wall thickness. Global RV systolic function is has normal systolic function. The tricuspid regurgitant velocity is 2.46 m/s, and with an assumed right atrial pressure  of 3 mmHg, the estimated right ventricular systolic pressure is normal at 27.2 mmHg. Left Atrium: Left atrial size was normal in size. Right Atrium: Right atrial size was normal in size Pericardium: Trivial pericardial effusion is present. Mitral Valve: The mitral valve is normal in structure. There is mild calcification of the mitral valve leaflet(s). Mild mitral annular calcification. No evidence of mitral valve regurgitation. No evidence of mitral valve stenosis by observation. Tricuspid Valve: The tricuspid valve is normal in structure. Tricuspid valve regurgitation is trivial. Aortic Valve: The aortic valve is normal in structure. Aortic valve regurgitation is trivial. Mild aortic valve sclerosis is present, with no evidence of aortic valve stenosis. Pulmonic Valve: The pulmonic valve was normal in structure. Pulmonic valve regurgitation is not  visualized. Pulmonic regurgitation is not visualized. Aorta: Aortic dilatation noted. There is mild dilatation of the aortic root measuring 41 mm. Venous: The inferior vena cava is normal in size with greater than 50% respiratory variability, suggesting right atrial pressure of 3 mmHg. IAS/Shunts: No atrial level shunt detected by color flow Doppler.  LEFT VENTRICLE PLAX 2D LVIDd:         4.70 cm LVIDs:         2.70 cm LV PW:         1.10 cm LV IVS:        1.20 cm LVOT diam:     2.20 cm LV SV:         75 ml LV SV Index:   30.99 LVOT Area:     3.80 cm  RIGHT VENTRICLE             IVC RV Basal diam:  2.30 cm     IVC diam: 1.70 cm RV S prime:     19.40 cm/s TAPSE (M-mode): 1.7 cm LEFT ATRIUM             Index       RIGHT ATRIUM           Index LA diam:        4.80 cm 2.05 cm/m  RA Area:     13.40 cm LA Vol (A2C):   72.6 ml 31.04 ml/m RA Volume:   26.70 ml  11.42 ml/m LA Vol (A4C):   58.1 ml 24.84 ml/m LA Biplane Vol: 66.1 ml 28.26 ml/m  AORTIC VALVE LVOT Vmax:   117.00 cm/s LVOT Vmean:  78.400 cm/s LVOT VTI:    0.221 m  AORTA Ao Root diam: 4.60 cm Ao Asc diam:  3.70 cm MV A velocity: 126.00 cm/s 70.3 cm/s TRICUSPID VALVE                                      TR Peak grad:   24.2  mmHg                                      TR Vmax:        246.00 cm/s                                       SHUNTS                                      Systemic VTI:  0.22 m                                      Systemic Diam: 2.20 cm  Loralie Champagne MD Electronically signed by Loralie Champagne MD Signature Date/Time: 08/28/2019/3:30:44 PM    Final    VAS [place] LOWER EXTREMITY VENOUS (DVT)  Result Date: 08/28/2019  Lower Venous Study Indications: Pulmonary embolism.  Risk Factors: Confirmed PE. Anticoagulation: Heparin. Limitations: Body habitus, poor ultrasound/tissue interface, patient positioning, open wound and bandages. Comparison Study: No prior studies. Performing Technologist: Oliver Hum RVT  Examination Guidelines: A complete  evaluation includes B-mode imaging, spectral Doppler, color Doppler, and power Doppler as needed of all accessible portions of each vessel. Bilateral testing is considered an integral part of a complete examination. Limited examinations for reoccurring indications may be performed as noted.  +---------+---------------+---------+-----------+----------+--------------+ RIGHT    CompressibilityPhasicitySpontaneityPropertiesThrombus Aging +---------+---------------+---------+-----------+----------+--------------+ CFV      Full           Yes      Yes                                 +---------+---------------+---------+-----------+----------+--------------+ SFJ      Full                                                        +---------+---------------+---------+-----------+----------+--------------+ FV Prox  Full                                                        +---------+---------------+---------+-----------+----------+--------------+ FV Mid   Full                                                        +---------+---------------+---------+-----------+----------+--------------+ FV DistalFull                                                        +---------+---------------+---------+-----------+----------+--------------+ PFV      Full                                                        +---------+---------------+---------+-----------+----------+--------------+  POP      Full           Yes      Yes                                 +---------+---------------+---------+-----------+----------+--------------+ PTV      Full                                                        +---------+---------------+---------+-----------+----------+--------------+ PERO     Full                                                        +---------+---------------+---------+-----------+----------+--------------+    +---------+---------------+---------+-----------+----------+--------------+ LEFT     CompressibilityPhasicitySpontaneityPropertiesThrombus Aging +---------+---------------+---------+-----------+----------+--------------+ CFV      Full           Yes      Yes                                 +---------+---------------+---------+-----------+----------+--------------+ SFJ      Full                                                        +---------+---------------+---------+-----------+----------+--------------+ FV Prox  Full                                                        +---------+---------------+---------+-----------+----------+--------------+ FV Mid   Full                                                        +---------+---------------+---------+-----------+----------+--------------+ FV DistalNone           No       No                   Acute          +---------+---------------+---------+-----------+----------+--------------+ PFV      Full                                                        +---------+---------------+---------+-----------+----------+--------------+ POP      Partial        Yes      Yes                  Acute          +---------+---------------+---------+-----------+----------+--------------+ PTV  Full                                                        +---------+---------------+---------+-----------+----------+--------------+ PERO                                                  Not visualized +---------+---------------+---------+-----------+----------+--------------+     Summary: Right: There is no evidence of deep vein thrombosis in the lower extremity. No cystic structure found in the popliteal fossa. Left: Findings consistent with acute deep vein thrombosis involving the left femoral vein, and left popliteal vein. No cystic structure found in the popliteal fossa.  *See table(s) above for measurements and  observations. Electronically signed by Ruta Hinds MD on 08/28/2019 at 2:51:08 PM.    Final     Scheduled Meds: . Chlorhexidine Gluconate Cloth  6 each Topical Daily  . cholecalciferol  5,000 Units Oral Daily  . [START ON 09/15/2019] cyanocobalamin  1,000 mcg Intramuscular Q30 days  . DULoxetine  60 mg Oral QHS  . gabapentin  300 mg Oral BID  . mouth rinse  15 mL Mouth Rinse BID  . pantoprazole  40 mg Oral Daily   Continuous Infusions: . sodium chloride 75 mL/hr at 08/29/19 0535  . heparin 1,600 Units/hr (08/29/19 0600)     LOS: 2 days   Marylu Lund, MD [facility] Pager On Amion  If 7PM-7AM, please contact night-coverage 08/29/2019, 2:57 PM

## 2019-08-29 NOTE — Progress Notes (Signed)
Patient desaturates severely in sleep(84%), due to mouth breathing. Nasal Cannula placed on bottom lip with 2L o2, patient O2 saturation improved to 92%.

## 2019-08-29 NOTE — Progress Notes (Signed)
Report given to Ronny Flurry RN as documented in SBAR handoff.

## 2019-08-29 NOTE — Progress Notes (Signed)
Blood soaked through foam dressing.  Cleaned with NS, dried, applied vaseline gauze, 4X4, ABD pad and wrapped with Kerlix.  Dr. Wyline Copas saw pt and agrees with dressing.

## 2019-08-30 ENCOUNTER — Inpatient Hospital Stay (HOSPITAL_COMMUNITY): Payer: Medicare HMO

## 2019-08-30 DIAGNOSIS — Y92009 Unspecified place in unspecified non-institutional (private) residence as the place of occurrence of the external cause: Secondary | ICD-10-CM

## 2019-08-30 DIAGNOSIS — J189 Pneumonia, unspecified organism: Secondary | ICD-10-CM

## 2019-08-30 DIAGNOSIS — W19XXXA Unspecified fall, initial encounter: Secondary | ICD-10-CM

## 2019-08-30 DIAGNOSIS — K219 Gastro-esophageal reflux disease without esophagitis: Secondary | ICD-10-CM

## 2019-08-30 DIAGNOSIS — I2694 Multiple subsegmental pulmonary emboli without acute cor pulmonale: Secondary | ICD-10-CM

## 2019-08-30 LAB — URINALYSIS, ROUTINE W REFLEX MICROSCOPIC
Bacteria, UA: NONE SEEN
Bilirubin Urine: NEGATIVE
Glucose, UA: NEGATIVE mg/dL
Ketones, ur: 5 mg/dL — AB
Leukocytes,Ua: NEGATIVE
Nitrite: NEGATIVE
Protein, ur: NEGATIVE mg/dL
Specific Gravity, Urine: 1.018 (ref 1.005–1.030)
pH: 5 (ref 5.0–8.0)

## 2019-08-30 LAB — CBC
HCT: 41.6 % (ref 39.0–52.0)
Hemoglobin: 13.9 g/dL (ref 13.0–17.0)
MCH: 30.5 pg (ref 26.0–34.0)
MCHC: 33.4 g/dL (ref 30.0–36.0)
MCV: 91.4 fL (ref 80.0–100.0)
Platelets: 142 10*3/uL — ABNORMAL LOW (ref 150–400)
RBC: 4.55 MIL/uL (ref 4.22–5.81)
RDW: 13.9 % (ref 11.5–15.5)
WBC: 5.2 10*3/uL (ref 4.0–10.5)
nRBC: 0 % (ref 0.0–0.2)

## 2019-08-30 LAB — FERRITIN: Ferritin: 370 ng/mL — ABNORMAL HIGH (ref 24–336)

## 2019-08-30 LAB — HEPARIN LEVEL (UNFRACTIONATED)
Heparin Unfractionated: 0.8 IU/mL — ABNORMAL HIGH (ref 0.30–0.70)
Heparin Unfractionated: 0.65 IU/mL (ref 0.30–0.70)

## 2019-08-30 LAB — C-REACTIVE PROTEIN: CRP: 3.1 mg/dL — ABNORMAL HIGH (ref <1.0)

## 2019-08-30 LAB — MRSA PCR SCREENING: MRSA by PCR: NEGATIVE

## 2019-08-30 MED ORDER — POLYETHYLENE GLYCOL 3350 17 G PO PACK
17.0000 g | PACK | Freq: Every day | ORAL | Status: DC
  Administered 2019-08-30 – 2019-09-03 (×2): 17 g via ORAL
  Filled 2019-08-30 (×6): qty 1

## 2019-08-30 MED ORDER — APIXABAN 5 MG PO TABS
10.0000 mg | ORAL_TABLET | Freq: Two times a day (BID) | ORAL | Status: DC
  Administered 2019-08-30 – 2019-09-04 (×10): 10 mg via ORAL
  Filled 2019-08-30 (×10): qty 2

## 2019-08-30 MED ORDER — SODIUM CHLORIDE 0.9 % IV SOLN
1.0000 g | INTRAVENOUS | Status: AC
  Administered 2019-08-30 – 2019-09-03 (×5): 1 g via INTRAVENOUS
  Filled 2019-08-30: qty 10
  Filled 2019-08-30 (×4): qty 1

## 2019-08-30 MED ORDER — PHENOL 1.4 % MT LIQD
1.0000 | OROMUCOSAL | Status: DC | PRN
  Administered 2019-08-30 (×2): 1 via OROMUCOSAL
  Filled 2019-08-30 (×2): qty 177

## 2019-08-30 MED ORDER — HEPARIN (PORCINE) 25000 UT/250ML-% IV SOLN
1450.0000 [IU]/h | INTRAVENOUS | Status: AC
  Administered 2019-08-30 (×2): 1450 [IU]/h via INTRAVENOUS

## 2019-08-30 MED ORDER — APIXABAN 5 MG PO TABS: 5.0000 mg | ORAL_TABLET | Freq: Two times a day (BID) | ORAL | Status: DC

## 2019-08-30 MED ORDER — APIXABAN 5 MG PO TABS: 10.0000 mg | ORAL_TABLET | Freq: Two times a day (BID) | ORAL | Status: DC

## 2019-08-30 MED ORDER — SODIUM CHLORIDE 0.9 % IV SOLN
500.0000 mg | INTRAVENOUS | Status: AC
  Administered 2019-08-30 – 2019-09-03 (×5): 500 mg via INTRAVENOUS
  Filled 2019-08-30 (×5): qty 500

## 2019-08-30 MED ORDER — METOPROLOL TARTRATE 25 MG PO TABS
12.5000 mg | ORAL_TABLET | Freq: Two times a day (BID) | ORAL | Status: DC
  Administered 2019-08-30 – 2019-09-04 (×11): 12.5 mg via ORAL
  Filled 2019-08-30 (×11): qty 1

## 2019-08-30 MED ORDER — METOPROLOL TARTRATE 5 MG/5ML IV SOLN
2.5000 mg | Freq: Two times a day (BID) | INTRAVENOUS | Status: AC
  Administered 2019-08-30: 03:00:00 2.5 mg via INTRAVENOUS
  Filled 2019-08-30: qty 5

## 2019-08-30 MED ORDER — SENNOSIDES-DOCUSATE SODIUM 8.6-50 MG PO TABS
1.0000 | ORAL_TABLET | Freq: Two times a day (BID) | ORAL | Status: DC
  Administered 2019-08-30 – 2019-09-04 (×11): 1 via ORAL
  Filled 2019-08-30 (×11): qty 1

## 2019-08-30 NOTE — Care Management (Signed)
Per [place] Collar. W/[employer] Co-pay amount for Eliquis 5mg . Bid for 30 day supply $47.00 Co-pay amount for Xeralto 20mg  and 15mg  for 30 day supply $47.00  No PA required No Deductible Tier 3 medication  Retail Pharmacy: [facility].  Ref.# [id number]

## 2019-08-30 NOTE — TOC Progression Note (Signed)
Transition of Care (TOC) - Progression Note    Patient Details  Name: Dustin Kendall Sr. MRN: [id number] Date of Birth: 1929-09-10  Transition of Care [facility]) CM/SW Contact  Purcell Mouton, RN Phone Number: 08/30/2019, 10:26 AM  Clinical Narrative:    Pt is from [facility] living with his wife. TOC will continue to follow for discharge needs.    Expected Discharge Plan: [place] Barriers to Discharge: No Barriers Identified  Expected Discharge Plan and Services Expected Discharge Plan: [place] arrangements for the past 2 months: Independent Living Facility([place])                                       Social Determinants of Health (SDOH) Interventions    Readmission Risk Interventions No flowsheet data found.

## 2019-08-30 NOTE — Evaluation (Signed)
Physical Therapy Evaluation Patient Details Name: Dustin Vasicek Sr. MRN: [id number] DOB: 1930/04/10 Today's Date: 08/30/2019   History of Present Illness  86 year old man admitted with dizziness and fall. Found to have large PE and heart strain L LE DVT.  PMH:  h/o rib fxs, peripheral neuropathy, DM prostate CA.  Clinical Impression  On eval, pt required Max Assist +2 for mobility. He was able to stand and take a couple of side steps along the bedside. He has moderate pain on L side of trunk from fall. Multiple skin tears. Will continue to follow and progress activity as tolerated. Recommend SNF.     Follow Up Recommendations SNF    Equipment Recommendations  None recommended by PT    Recommendations for Other Services       Precautions / Restrictions Precautions Precautions: Fall Precaution Comments: rib pain Restrictions Weight Bearing Restrictions: No      Mobility  Bed Mobility Overal bed mobility: Needs Assistance Bed Mobility: Rolling;Supine to Sit;Sit to Supine Rolling: Mod assist   Supine to sit: Max assist;+2 for physical assistance;+2 for safety/equipment;HOB elevated Sit to supine: Mod assist;+2 for physical assistance;+2 for safety/equipment;HOB elevated   General bed mobility comments: assist for LLE and trunk.  Used rails to assist. Also assisted with repositioning in bed using rails. Increased time. Multimodal cueing required.  Transfers Overall transfer level: Needs assistance Equipment used: Rolling walker (2 wheeled) Transfers: Sit to/from Stand Sit to Stand: Mod assist;+2 physical assistance;+2 safety/equipment;From elevated surface         General transfer comment: Assist to rise, stabilize, control descent. VCs safety, technique.  Ambulation/Gait Ambulation/Gait assistance: Mod assist;+2 physical assistance;+2 safety/equipment           General Gait Details: side steps along side of bed using RW. Assist to steady pt and manage  RW.  Stairs            Wheelchair Mobility    Modified Rankin (Stroke Patients Only)       Balance Overall balance assessment: Needs assistance Sitting-balance support: Bilateral upper extremity supported;Feet supported Sitting balance-Leahy Scale: Poor Sitting balance - Comments: tends to lose balance posteriorly Postural control: Posterior lean Standing balance support: Bilateral upper extremity supported Standing balance-Leahy Scale: Poor                               Pertinent Vitals/Pain Pain Assessment: Faces Faces Pain Scale: Hurts even more Pain Location: ribs when coughing, back, legs and feet Pain Descriptors / Indicators: Discomfort;Aching;Sore Pain Intervention(s): Limited activity within patient's tolerance;Monitored during session;Repositioned    Home Living Family/patient expects to be discharged to:: Skilled nursing facility Living Arrangements: Spouse/significant other Available Help at Discharge: Family Type of Home: Independent living facility Home Access: Level entry     Home Layout: One level Home Equipment: Environmental consultant - 4 wheels Additional Comments: from [employer]    Prior Function Level of Independence: Needs assistance   Gait / Transfers Assistance Needed: uses rollator     Comments: needed assistance only with compression socks, extra time for adls     Hand Dominance        Extremity/Trunk Assessment   Upper Extremity Assessment Upper Extremity Assessment: Defer to OT evaluation    Lower Extremity Assessment Lower Extremity Assessment: Generalized weakness    Cervical / Trunk Assessment Cervical / Trunk Assessment: Kyphotic  Communication   Communication: HOH  Cognition Arousal/Alertness: Awake/alert Behavior During Therapy: [facility] for  tasks assessed/performed Overall Cognitive Status: Impaired/Different from baseline Area of Impairment: Memory;Safety/judgement;Problem solving                      Memory: Decreased short-term memory Following Commands: Follows one step commands with increased time Safety/Judgement: Decreased awareness of safety   Problem Solving: Difficulty sequencing;Requires verbal cues;Requires tactile cues        General Comments General comments (skin integrity, edema, etc.): VSS, on RA.  Pt states when he gets dizzy, he gets lightheaded like he'll black out, spins and off balance    Exercises     Assessment/Plan    PT Assessment Patient needs continued PT services  PT Problem List Decreased strength;Decreased mobility;Decreased range of motion;Decreased activity tolerance;Decreased balance;Decreased knowledge of use of DME;Pain;Decreased skin integrity;Decreased safety awareness;Decreased cognition       PT Treatment Interventions DME instruction;Gait training;Therapeutic activities;Therapeutic exercise;Patient/family education;Balance training;Functional mobility training    PT Goals (Current goals can be found in the Care Plan section)  Acute Rehab PT Goals Patient Stated Goal: none stated; agreeable to therapy PT Goal Formulation: With patient/family Time For Goal Achievement: 09/13/19 Potential to Achieve Goals: Good    Frequency Min 3X/week   Barriers to discharge        Co-evaluation PT/OT/SLP Co-Evaluation/Treatment: Yes Reason for Co-Treatment: For patient/therapist safety PT goals addressed during session: Mobility/safety with mobility OT goals addressed during session: ADL's and self-care       AM-PAC PT "6 Clicks" Mobility  Outcome Measure Help needed turning from your back to your side while in a flat bed without using bedrails?: A Lot Help needed moving from lying on your back to sitting on the side of a flat bed without using bedrails?: A Lot Help needed moving to and from a bed to a chair (including a wheelchair)?: A Lot Help needed standing up from a chair using your arms (e.g., wheelchair or bedside chair)?: A Lot Help  needed to walk in hospital room?: A Lot Help needed climbing 3-5 steps with a railing? : Total 6 Click Score: 11    End of Session Equipment Utilized During Treatment: Gait belt Activity Tolerance: Patient limited by fatigue;Patient limited by pain Patient left: in bed;with call bell/phone within reach;with bed alarm set;with family/visitor present   PT Visit Diagnosis: Muscle weakness (generalized) (M62.81);Difficulty in walking, not elsewhere classified (R26.2);Pain;History of falling (Z91.81);Repeated falls (R29.6)    Time: [id number] PT Time Calculation (min) (ACUTE ONLY): 29 min   Charges:   PT Evaluation $PT Eval Moderate Complexity: 1 Mod            Kiril Hippe P, PT Acute Rehabilitation

## 2019-08-30 NOTE — Discharge Instructions (Addendum)
Information on my medicine - ELIQUIS (apixaban)  This medication education was reviewed with me or my healthcare representative as part of my discharge preparation.  Why was Eliquis prescribed for you? Eliquis was prescribed to treat blood clots that may have been found in the veins of your legs (deep vein thrombosis) or in your lungs (pulmonary embolism) and to reduce the risk of them occurring again.  What do You need to know about Eliquis ? The starting dose is 10 mg (two 5 mg tablets) taken TWICE daily for the FIRST SEVEN (7) DAYS, then on (enter date)  09/06/19  the dose is reduced to ONE 5 mg tablet taken TWICE daily.  Eliquis may be taken with or without food.   Try to take the dose about the same time in the morning and in the evening. If you have difficulty swallowing the tablet whole please discuss with your pharmacist how to take the medication safely.  Take Eliquis exactly as prescribed and DO NOT stop taking Eliquis without talking to the doctor who prescribed the medication.  Stopping may increase your risk of developing a new blood clot.  Refill your prescription before you run out.  After discharge, you should have regular check-up appointments with your healthcare provider that is prescribing your Eliquis.    What do you do if you miss a dose? If a dose of ELIQUIS is not taken at the scheduled time, take it as soon as possible on the same day and twice-daily administration should be resumed. The dose should not be doubled to make up for a missed dose.  Important Safety Information A possible side effect of Eliquis is bleeding. You should call your healthcare provider right away if you experience any of the following: ? Bleeding from an injury or your nose that does not stop. ? Unusual colored urine (red or dark brown) or unusual colored stools (red or black). ? Unusual bruising for unknown reasons. ? A serious fall or if you hit your head (even if there is no  bleeding).  Some medicines may interact with Eliquis and might increase your risk of bleeding or clotting while on Eliquis. To help avoid this, consult your healthcare provider or pharmacist prior to using any new prescription or non-prescription medications, including herbals, vitamins, non-steroidal anti-inflammatory drugs (NSAIDs) and supplements.  This website has more information on Eliquis (apixaban): http://www.eliquis.com/eliquis/home

## 2019-08-30 NOTE — Evaluation (Signed)
Occupational Therapy Evaluation Patient Details Name: Dustin Losier Sr. MRN: [id number] DOB: Nov 08, 1929 Today's Date: 08/30/2019    History of Present Illness 86 year old man admitted with DKA, s/p dizziness and fall. Found to have large PE and heart strain.  PMH:  h/o rib fxs, peripheral neuropathy, DM prostate CA.   Clinical Impression   Pt was admitted for the above. At baseline, he is mod I with the exception of compression socks and taking extra time.  . Wife present, providing some of the history.  Pt has poor sitting balance and he needed mod/max +2 assistance for mobility, up to total +2 for hygiene and LB dressing at this time.  Will follow in acute setting with the goals listed below.  He will benefit from SNF after acute hospitalization    Follow Up Recommendations  SNF    Equipment Recommendations  (defer to next venue)    Recommendations for Other Services       Precautions / Restrictions Precautions Precautions: Fall Precaution Comments: rib pain Restrictions Weight Bearing Restrictions: No      Mobility Bed Mobility Overal bed mobility: Needs Assistance Bed Mobility: Supine to Sit;Sit to Supine Rolling: Mod assist   Supine to sit: Max assist;+2 for physical assistance;HOB elevated Sit to supine: Mod assist;+2 for physical assistance   General bed mobility comments: assist for LLE and trunk.  Used rails to assist. Also assisted with repositioning in bed using rails  Transfers Overall transfer level: Needs assistance Equipment used: Rolling walker (2 wheeled) Transfers: Sit to/from Stand Sit to Stand: Mod assist;+2 physical assistance         General transfer comment: assist to rise and stabilize    Balance Overall balance assessment: History of Falls;Needs assistance Sitting-balance support: Bilateral upper extremity supported Sitting balance-Leahy Scale: Poor Sitting balance - Comments: tends to lose balance posteriorly                                    ADL either performed or assessed with clinical judgement   ADL Overall ADL's : Needs assistance/impaired Eating/Feeding: Set up   Grooming: Set up   Upper Body Bathing: Minimal assistance   Lower Body Bathing: Maximal assistance;+2 for physical assistance   Upper Body Dressing : Minimal assistance   Lower Body Dressing: Total assistance;+2 for safety/equipment                 General ADL Comments: stood and sidestepped up [facility] with assistance to weight shift and took a couple of small steps.  Fatiques easily.  Rib pain when coughing     Vision   Additional Comments: can only see out of L eye     Perception     Praxis      Pertinent Vitals/Pain Pain Assessment: Faces Faces Pain Scale: Hurts even more Pain Location: ribs when coughing, back, legs and feet Pain Descriptors / Indicators: Aching Pain Intervention(s): Limited activity within patient's tolerance;Repositioned;Monitored during session     Hand Dominance     Extremity/Trunk Assessment Upper Extremity Assessment Upper Extremity Assessment: Generalized weakness           Communication Communication Communication: HOH   Cognition Arousal/Alertness: Awake/alert Behavior During Therapy: WFL for tasks assessed/performed Overall Cognitive Status: Impaired/Different from baseline Area of Impairment: Memory;Following commands                     Memory: Decreased short-term  memory Following Commands: Follows one step commands consistently(with multimodal cues)           General Comments  VSS, on RA.  Pt states when he gets dizzy, he gets lightheaded like he'll black out, spins and off balance    Exercises     Shoulder Instructions      Home Living Family/patient expects to be discharged to:: Skilled nursing facility                                 Additional Comments: from [employer]      Prior Functioning/Environment Level of  Independence: Independent with assistive device(s)        Comments: needed assistance only with compression socks, extra time for adls        OT Problem List: Decreased strength;Decreased activity tolerance;Impaired balance (sitting and/or standing);Impaired vision/perception;Decreased cognition;Decreased knowledge of use of DME or AE;Pain      OT Treatment/Interventions: Self-care/ADL training;DME and/or AE instruction;Patient/family education;Balance training;Therapeutic activities;Therapeutic exercise    OT Goals(Current goals can be found in the care plan section) Acute Rehab OT Goals Patient Stated Goal: none stated; agreeable to therapy OT Goal Formulation: With patient Time For Goal Achievement: 09/13/19 Potential to Achieve Goals: Good ADL Goals Pt Will Transfer to Toilet: with min assist;with +2 assist;bedside commode;stand pivot transfer Additional ADL Goal #1: pt will perform UB adls from unsupported sitting with set up Additional ADL Goal #2: Pt will perform sit to stand with min A and maintain at this level for 2 minutes for adls Additional ADL Goal #3: pt will perform bed mobility at min A level in preparation for adls  OT Frequency: Min 2X/week   Barriers to D/C:            Co-evaluation PT/OT/SLP Co-Evaluation/Treatment: Yes Reason for Co-Treatment: For patient/therapist safety PT goals addressed during session: Mobility/safety with mobility OT goals addressed during session: ADL's and self-care      AM-PAC OT "6 Clicks" Daily Activity     Outcome Measure Help from another person eating meals?: A Little Help from another person taking care of personal grooming?: A Little Help from another person toileting, which includes using toliet, bedpan, or urinal?: Total Help from another person bathing (including washing, rinsing, drying)?: A Lot Help from another person to put on and taking off regular upper body clothing?: A Little Help from another person to put  on and taking off regular lower body clothing?: Total 6 Click Score: 13   End of Session Nurse Communication: (LLE dressing/purewick need attention)  Activity Tolerance: Patient limited by fatigue Patient left: in bed;with call bell/phone within reach;with bed alarm set;with family/visitor present  OT Visit Diagnosis: Unsteadiness on feet (R26.81);Muscle weakness (generalized) (M62.81);History of falling (Z91.81)                Time: [id number] OT Time Calculation (min): 31 min Charges:  OT General Charges $OT Visit: 1 Visit OT Evaluation $OT Eval Moderate Complexity: 1 Mod  Rajan Burgard S, OTR/L Acute Rehabilitation Services 08/30/2019  Linnie Mcglocklin 08/30/2019, 1:28 PM

## 2019-08-30 NOTE — Progress Notes (Signed)
[facility] for Heparin Indication: pulmonary embolus  Allergies  Allergen Reactions  . Hydrocodone Rash    Mild rash one time, but has taken other agents with no reaction    Patient Measurements: Height: 6' (182.9 cm) Weight: 249 lb 1.9 oz (113 kg) IBW/kg (Calculated) : 77.6 Heparin Dosing Weight: 100 kg  Vital Signs: Temp: 99 F (37.2 C) (01/20 0356) Temp Source: Oral (01/20 0356) BP: 161/94 (01/20 0600) Pulse Rate: 83 (01/20 0600)  Labs: Recent Labs    08/28/19 0018 08/28/19 0018 08/28/19 0843 08/28/19 1906 08/29/19 0406 08/30/19 0458  HGB 14.5   < >  --   --  14.1 13.9  HCT 42.8  --   --   --  41.9 41.6  PLT 159  --   --   --  148* 142*  HEPARINUNFRC 0.47  --    < > 0.71* 0.64 0.80*  CREATININE 0.89  --   --   --   --   --    < > = values in this interval not displayed.    Estimated Creatinine Clearance: 75.9 mL/min (by C-G formula based on SCr of 0.89 mg/dL).     Assessment: 86 y.o. male  presented to ED with left sided chest pain. He was found to have bilateral PEs with severe clot burden. Pharmacy consulted to dose heparin for PE. LLE DVT per doppler.    1/19 Heparin level is therapeutic at 0.64 after rate decreased to 1600 units/hr. Hg stable at 14.1, PLT stable but low at 148   No bleeding reported Today, 1/20 0458 HL = 0.80 units/ml above goal. RN reports no problems with the infusion but he does have a skin tear on his arm that is bleeding. (she changed dressing 3-4 times in her shift) H/H stable, Plts = 142 low  Goal of Therapy:  Heparin level 0.3-0.7 units/ml Monitor platelets by anticoagulation protocol: Yes   Plan:  -decrease heparin drip to 1450 units/hr -recheck HL in 8 hours - daily heparin level and CBC  - Monitor patient for s/s of bleeding and CBC  - f/u transition to oral anticoagulation  Dorrene German 08/30/2019 6:25 AM

## 2019-08-30 NOTE — Progress Notes (Signed)
PROGRESS NOTE    Dustin Bowland Sr.  GEZ:[id number] DOB: Feb 15, 1930 DOA: 08/27/2019 PCP: Javier Glazier, MD    Brief Narrative:  86 y.o. male with medical history significant of peripheral neuropathy, history of prostate cancer, GERD, prior duodenal ulcer, osteoporosis and ventral hernia who has had previous falls last year with left-sided rib fractures.  Patient started feeling dizzy about 3 days ago.  He usually is active walking around the neighborhood.  Lately his activities have been decreased with the COVID-19.  Patient noted the dizziness but has not had any shortness of breath or cough.  Today however he fell and had lacerations mainly on his left upper and lower extremities as well as the rib cage where he had previous rib fractures.  Wife is a retired [employer] scientist.  She came and helped him and dress his wounds.  She brought him to the ER for evaluation.  In the ER evaluation showed submassive pulmonary embolism.  Patient also mildly hypoxic.  He was seen at [facility] where he was initiated on IV heparin and sent over for further work-up.  He denied any fever or chills.  Denied any cough.  Denied any sick contacts.  COVID-19 screen done so far negative  Assessment & Plan:   Principal Problem:   Pulmonary embolism (HCC) Active Problems:   GERD (gastroesophageal reflux disease)   Fall at home, initial encounter  Submassive pulmonary embolism Currently saturating around 92% on room air CTA chest showed acute bilateral occlusive PE with severe clot burden PESI score of 147 LE dopplers reviewed. Finding of LLE DVT in L femoral vein and L popliteal vein Echo showed EF of 60 to 65%, grade 1 diastolic dysfunction, no regional wall motion abnormality S/p heparin gtt, switch to NOAC  Fever, likely 2/2 PNA, r/o covid PNA Noted spike on 08/29/2019 as high as 101 Currently afebrile, with no leukocytosis CRP 3.1, ferritin 370 Repeat Covid screen pending UA showed large blood, RBC  21-50, otherwise unremarkable, UC pending BC x2 pending Chest x-ray showed bilateral interstitial and patchy alveolar airspace opacities concerning for multilobar pneumonia including atypical viral pneumonia, underlying mild chronic interstitial lung disease Start ceftriaxone, azithromycin Monitor closely  GERD Cont PPI  Fall Due to gait abnormalities.  Patient apparently has had recurrent falls at home.  At this point he has multiple bruises.  PT OT now consulted.  Lives at home with the wife.      DVT prophylaxis: Eliquis Code Status: Full Family Communication: None at bedside Disposition Plan: Likely back to ALF    Consultants:   None  Procedures:   None  Antimicrobials: Anti-infectives (From admission, onward)   Start     Dose/Rate Route Frequency Ordered Stop   08/30/19 1530  azithromycin (ZITHROMAX) 500 mg in sodium chloride 0.9 % 250 mL IVPB     500 mg 250 mL/hr over 60 Minutes Intravenous Every 24 hours 08/30/19 1455     08/30/19 1500  cefTRIAXone (ROCEPHIN) 1 g in sodium chloride 0.9 % 100 mL IVPB     1 g 200 mL/hr over 30 Minutes Intravenous Every 24 hours 08/30/19 1455          Subjective: Complain of left rib pain, otherwise no new complaints    Objective: Vitals:   08/30/19 1000 08/30/19 1100 08/30/19 1200 08/30/19 1457  BP: (!) 147/86 (!) 172/105 128/71 121/76  Pulse: 90 73 95 95  Resp: (!) 21 (!) 25 (!) 24 20  Temp:   98.4 F (  36.9 C) (!) 100.7 F (38.2 C)  TempSrc:   Oral Oral  SpO2: 90% 94% 92% 91%  Weight:      Height:        Intake/Output Summary (Last 24 hours) at 08/30/2019 1458 Last data filed at 08/30/2019 1017 Gross per 24 hour  Intake 2812.66 ml  Output 1000 ml  Net 1812.66 ml   Filed Weights   08/27/19 0450 08/27/19 2100  Weight: 114.3 kg 113 kg    Examination:  General: NAD   Cardiovascular: S1, S2 present  Respiratory: CTAB  Abdomen: Soft, nontender, nondistended, bowel sounds present  Musculoskeletal:  No bilateral pedal edema noted  Skin: Normal  Psychiatry: Normal mood   Data Reviewed: I have personally reviewed following labs and imaging studies  CBC: Recent Labs  Lab 08/27/19 0522 08/28/19 0018 08/29/19 0406 08/30/19 0458  WBC 6.5 7.3 5.0 5.2  NEUTROABS 4.8  --   --   --   HGB 14.4 14.5 14.1 13.9  HCT 42.8 42.8 41.9 41.6  MCV 91.1 90.7 90.3 91.4  PLT 159 159 148* 510*   Basic Metabolic Panel: Recent Labs  Lab 08/27/19 0522 08/28/19 0018  NA 140 141  K 3.9 3.6  CL 106 106  CO2 26 28  GLUCOSE 126* 110*  BUN 16 13  CREATININE 0.97 0.89  CALCIUM 8.7* 8.8*   GFR: Estimated Creatinine Clearance: 75.9 mL/min (by C-G formula based on SCr of 0.89 mg/dL). Liver Function Tests: Recent Labs  Lab 08/28/19 0018  AST 22  ALT 15  ALKPHOS 70  BILITOT 0.8  PROT 6.2*  ALBUMIN 3.5   No results for input(s): LIPASE, AMYLASE in the last 168 hours. No results for input(s): AMMONIA in the last 168 hours. Coagulation Profile: No results for input(s): INR, PROTIME in the last 168 hours. Cardiac Enzymes: No results for input(s): CKTOTAL, CKMB, CKMBINDEX, TROPONINI in the last 168 hours. BNP (last 3 results) No results for input(s): PROBNP in the last 8760 hours. HbA1C: No results for input(s): HGBA1C in the last 72 hours. CBG: No results for input(s): GLUCAP in the last 168 hours. Lipid Profile: No results for input(s): CHOL, HDL, LDLCALC, TRIG, CHOLHDL, LDLDIRECT in the last 72 hours. Thyroid Function Tests: No results for input(s): TSH, T4TOTAL, FREET4, T3FREE, THYROIDAB in the last 72 hours. Anemia Panel: Recent Labs    08/30/19 0726  FERRITIN 370*   Sepsis Labs: No results for input(s): PROCALCITON, LATICACIDVEN in the last 168 hours.  Recent Results (from the past 240 hour(s))  SARS Coronavirus 2 Ag (30 min TAT) - Nasal Swab (BD Veritor Kit)     Status: None   Collection Time: 08/27/19  5:22 AM   Specimen: Nasal Swab (BD Veritor Kit)  Result Value Ref  Range Status   SARS Coronavirus 2 Ag NEGATIVE NEGATIVE Final    Comment: (NOTE) SARS-CoV-2 antigen NOT DETECTED.  Negative results are presumptive.  Negative results do not preclude SARS-CoV-2 infection and should not be used as the sole basis for treatment or other patient management decisions, including infection  control decisions, particularly in the presence of clinical signs and  symptoms consistent with COVID-19, or in those who have been in contact with the virus.  Negative results must be combined with clinical observations, patient history, and epidemiological information. The expected result is Negative. Fact Sheet for Patients: PodPark.tn Fact Sheet for Healthcare Providers: GiftContent.is This test is not yet approved or cleared by the [place] FDA and  has been authorized  for detection and/or diagnosis of SARS-CoV-2 by FDA under an Emergency Use Authorization (EUA).  This EUA will remain in effect (meaning this test can be used) for the duration of  the COVID-19 de claration under Section 564(b)(1) of the Act, 21 U.S.C. section 360bbb-3(b)(1), unless the authorization is terminated or revoked sooner. Performed at [facility], [address] [place], [place] [address]   SARS CORONAVIRUS 2 (TAT 6-24 HRS) Nasopharyngeal Nasopharyngeal Swab     Status: None   Collection Time: 08/27/19  8:00 AM   Specimen: Nasopharyngeal Swab  Result Value Ref Range Status   SARS Coronavirus 2 NEGATIVE NEGATIVE Final    Comment: (NOTE) SARS-CoV-2 target nucleic acids are NOT DETECTED. The SARS-CoV-2 RNA is generally detectable in upper and lower respiratory specimens during the acute phase of infection. Negative results do not preclude SARS-CoV-2 infection, do not rule out co-infections with other pathogens, and should not be used as the sole basis for treatment or other patient management decisions. Negative  results must be combined with clinical observations, patient history, and epidemiological information. The expected result is Negative. Fact Sheet for Patients: SugarRoll.be Fact Sheet for Healthcare Providers: https://www.woods-mathews.com/ This test is not yet approved or cleared by the [place] FDA and  has been authorized for detection and/or diagnosis of SARS-CoV-2 by FDA under an Emergency Use Authorization (EUA). This EUA will remain  in effect (meaning this test can be used) for the duration of the COVID-19 declaration under Section 56 4(b)(1) of the Act, 21 U.S.C. section 360bbb-3(b)(1), unless the authorization is terminated or revoked sooner. Performed at [facility], [place] [address] [place], [place] [address]      Radiology Studies: DG Chest Port 1 View  Result Date: 08/30/2019 CLINICAL DATA:  Fever, increased heart rate EXAM: PORTABLE CHEST 1 VIEW COMPARISON:  08/27/2019 FINDINGS: There is bilateral mild interstitial thickening likely reflecting an element of underlying chronic interstitial disease. There are patchy alveolar airspace opacities bilaterally concerning for multilobar pneumonia. There is no pleural effusion or pneumothorax. The heart and mediastinal contours are unremarkable. There is no acute osseous abnormality. IMPRESSION: 1. Bilateral interstitial and patchy alveolar airspace opacities concerning for multilobar pneumonia including atypical viral pneumonia. 2. Underlying mild chronic interstitial lung disease. Electronically Signed   By: Kathreen Devoid   On: 08/30/2019 08:07   ECHOCARDIOGRAM COMPLETE  Result Date: 08/28/2019   ECHOCARDIOGRAM REPORT   Patient Name:   Dustin Macdougal Sr. Date of Exam: 08/28/2019 Medical Rec #:  [id number]           Height:       72.0 in Accession #:    [id number]          Weight:       249.1 lb Date of Birth:  1929/11/28          BSA:          2.34 m Patient Age:    36 years             BP:           139/73 mmHg Patient Gender: M                   HR:           103 bpm. Exam Location:  Inpatient Procedure: 2D Echo, Cardiac Doppler and Color Doppler Indications:    I26.02 Pulmonary embolus  History:        Patient has prior history of  Echocardiogram examinations, most                 recent 01/22/2018. Risk Factors:GERD. Cancer.  Sonographer:    Jonelle Sidle Dance Referring Phys: [place]  1. Left ventricular ejection fraction, by visual estimation, is 60 to 65%. The left ventricle has normal function. There is no left ventricular hypertrophy.  2. Left ventricular diastolic parameters are consistent with Grade I diastolic dysfunction (impaired relaxation).  3. The left ventricle has no regional wall motion abnormalities.  4. Global right ventricle has normal systolic function.The right ventricular size is normal. No increase in right ventricular wall thickness.  5. Left atrial size was normal.  6. Right atrial size was normal.  7. Mild mitral annular calcification.  8. The mitral valve is normal in structure. No evidence of mitral valve regurgitation. No evidence of mitral stenosis.  9. The tricuspid valve is normal in structure. Tricuspid valve regurgitation is trivial. 10. The aortic valve is normal in structure. Aortic valve regurgitation is trivial. Mild aortic valve sclerosis without stenosis. 11. There is mild dilatation of the aortic root measuring 41 mm. 12. The inferior vena cava is normal in size with greater than 50% respiratory variability, suggesting right atrial pressure of 3 mmHg. 13. The tricuspid regurgitant velocity is 2.46 m/s, and with an assumed right atrial pressure of 3 mmHg, the estimated right ventricular systolic pressure is normal at 27.2 mmHg. 14. Trivial pericardial effusion is present. FINDINGS  Left Ventricle: Left ventricular ejection fraction, by visual estimation, is 60 to 65%. The left ventricle has normal function. The left ventricle has no  regional wall motion abnormalities. The left ventricular internal cavity size was the left ventricle is normal in size. There is no left ventricular hypertrophy. Left ventricular diastolic parameters are consistent with Grade I diastolic dysfunction (impaired relaxation). Right Ventricle: The right ventricular size is normal. No increase in right ventricular wall thickness. Global RV systolic function is has normal systolic function. The tricuspid regurgitant velocity is 2.46 m/s, and with an assumed right atrial pressure  of 3 mmHg, the estimated right ventricular systolic pressure is normal at 27.2 mmHg. Left Atrium: Left atrial size was normal in size. Right Atrium: Right atrial size was normal in size Pericardium: Trivial pericardial effusion is present. Mitral Valve: The mitral valve is normal in structure. There is mild calcification of the mitral valve leaflet(s). Mild mitral annular calcification. No evidence of mitral valve regurgitation. No evidence of mitral valve stenosis by observation. Tricuspid Valve: The tricuspid valve is normal in structure. Tricuspid valve regurgitation is trivial. Aortic Valve: The aortic valve is normal in structure. Aortic valve regurgitation is trivial. Mild aortic valve sclerosis is present, with no evidence of aortic valve stenosis. Pulmonic Valve: The pulmonic valve was normal in structure. Pulmonic valve regurgitation is not visualized. Pulmonic regurgitation is not visualized. Aorta: Aortic dilatation noted. There is mild dilatation of the aortic root measuring 41 mm. Venous: The inferior vena cava is normal in size with greater than 50% respiratory variability, suggesting right atrial pressure of 3 mmHg. IAS/Shunts: No atrial level shunt detected by color flow Doppler.  LEFT VENTRICLE PLAX 2D LVIDd:         4.70 cm LVIDs:         2.70 cm LV PW:         1.10 cm LV IVS:        1.20 cm LVOT diam:     2.20 cm LV SV:  75 ml LV SV Index:   30.99 LVOT Area:     3.80 cm   RIGHT VENTRICLE             IVC RV Basal diam:  2.30 cm     IVC diam: 1.70 cm RV S prime:     19.40 cm/s TAPSE (M-mode): 1.7 cm LEFT ATRIUM             Index       RIGHT ATRIUM           Index LA diam:        4.80 cm 2.05 cm/m  RA Area:     13.40 cm LA Vol (A2C):   72.6 ml 31.04 ml/m RA Volume:   26.70 ml  11.42 ml/m LA Vol (A4C):   58.1 ml 24.84 ml/m LA Biplane Vol: 66.1 ml 28.26 ml/m  AORTIC VALVE LVOT Vmax:   117.00 cm/s LVOT Vmean:  78.400 cm/s LVOT VTI:    0.221 m  AORTA Ao Root diam: 4.60 cm Ao Asc diam:  3.70 cm MV A velocity: 126.00 cm/s 70.3 cm/s TRICUSPID VALVE                                      TR Peak grad:   24.2 mmHg                                      TR Vmax:        246.00 cm/s                                       SHUNTS                                      Systemic VTI:  0.22 m                                      Systemic Diam: 2.20 cm  Loralie Champagne MD Electronically signed by Loralie Champagne MD Signature Date/Time: 08/28/2019/3:30:44 PM    Final     Scheduled Meds: . Chlorhexidine Gluconate Cloth  6 each Topical Daily  . cholecalciferol  5,000 Units Oral Daily  . [START ON 09/15/2019] cyanocobalamin  1,000 mcg Intramuscular Q30 days  . DULoxetine  60 mg Oral QHS  . gabapentin  300 mg Oral BID  . mouth rinse  15 mL Mouth Rinse BID  . metoprolol tartrate  12.5 mg Oral BID  . pantoprazole  40 mg Oral Daily  . polyethylene glycol  17 g Oral Daily  . senna-docusate  1 tablet Oral BID   Continuous Infusions: . azithromycin    . cefTRIAXone (ROCEPHIN)  IV    . heparin 1,450 Units/hr (08/30/19 0956)     LOS: 3 days   Alma Friendly, MD [facility] Pager On Amion  If 7PM-7AM, please contact night-coverage 08/30/2019, 2:58 PM

## 2019-08-30 NOTE — Progress Notes (Signed)
Dustin Hines for Heparin >> Eliquis Indication: pulmonary embolus  Allergies  Allergen Reactions  . Hydrocodone Rash    Mild rash one time, but has taken other agents with no reaction    Patient Measurements: Height: 6' (182.9 cm) Weight: 249 lb 12.5 oz (113.3 kg) IBW/kg (Calculated) : 77.6  Vital Signs: Temp: 100.7 F (38.2 C) (01/20 1457) Temp Source: Oral (01/20 1457) BP: 121/76 (01/20 1457) Pulse Rate: 95 (01/20 1457)  Labs: Recent Labs    08/28/19 0018 08/28/19 0843 08/29/19 0406 08/30/19 0458 08/30/19 1416  HGB 14.5  --  14.1 13.9  --   HCT 42.8  --  41.9 41.6  --   PLT 159  --  148* 142*  --   HEPARINUNFRC 0.47   < > 0.64 0.80* 0.65  CREATININE 0.89  --   --   --   --    < > = values in this interval not displayed.    Estimated Creatinine Clearance: 76 mL/min (by C-G formula based on SCr of 0.89 mg/dL).   Assessment: 86 y.o. male  presented to ED with left sided chest pain. He was found to have bilateral PEs with severe clot burden. Pharmacy consulted to dose heparin for PE. LLE DVT per doppler.     Today, 08/30/2019:  CBC remains stable; Plt slightly low  Most recent heparin level therapeutic on 1450 units/hr  Pharmacy consulted to transition UFH to Eliquis  Goal of Therapy:  Heparin level 0.3-0.7 units/ml Monitor platelets by anticoagulation protocol: Yes   Plan:   Eliquis 10 mg PO bid x 7 days, thereafter 5 mg PO BID  Stop heparin infusion with first dose of Eliquis  Pharmacy to provide patient education prior to discharge   Sravya Grissom A 08/30/2019 4:08 PM

## 2019-08-30 NOTE — Progress Notes (Signed)
Pt HR got up to 140s-150s several times within 20 minutes. HR now back to 90s-100s. EKG done & in chart. [facility] p[facility].

## 2019-08-31 LAB — BASIC METABOLIC PANEL
Anion gap: 10 (ref 5–15)
BUN: 18 mg/dL (ref 8–23)
CO2: 23 mmol/L (ref 22–32)
Calcium: 8.4 mg/dL — ABNORMAL LOW (ref 8.9–10.3)
Chloride: 101 mmol/L (ref 98–111)
Creatinine, Ser: 0.91 mg/dL (ref 0.61–1.24)
GFR calc Af Amer: >60 mL/min (ref >60)
GFR calc non Af Amer: >60 mL/min (ref >60)
Glucose, Bld: 109 mg/dL — ABNORMAL HIGH (ref 70–99)
Potassium: 4.1 mmol/L (ref 3.5–5.1)
Sodium: 134 mmol/L — ABNORMAL LOW (ref 135–145)

## 2019-08-31 LAB — CBC
HCT: 40.3 % (ref 39.0–52.0)
Hemoglobin: 13.5 g/dL (ref 13.0–17.0)
MCH: 29.9 pg (ref 26.0–34.0)
MCHC: 33.5 g/dL (ref 30.0–36.0)
MCV: 89.2 fL (ref 80.0–100.0)
Platelets: 144 10*3/uL — ABNORMAL LOW (ref 150–400)
RBC: 4.52 MIL/uL (ref 4.22–5.81)
RDW: 13.8 % (ref 11.5–15.5)
WBC: 7.4 10*3/uL (ref 4.0–10.5)
nRBC: 0 % (ref 0.0–0.2)

## 2019-08-31 LAB — PROCALCITONIN: Procalcitonin: 0.13 ng/mL

## 2019-08-31 LAB — SARS CORONAVIRUS 2 (TAT 6-24 HRS): SARS Coronavirus 2: POSITIVE — AB

## 2019-08-31 MED ORDER — ALBUTEROL SULFATE HFA 108 (90 BASE) MCG/ACT IN AERS
1.0000 | INHALATION_SPRAY | Freq: Four times a day (QID) | RESPIRATORY_TRACT | Status: AC
  Administered 2019-08-31: 19:00:00 1 via RESPIRATORY_TRACT
  Administered 2019-09-01 (×4): 2 via RESPIRATORY_TRACT
  Filled 2019-08-31: qty 6.7

## 2019-08-31 MED ORDER — DEXAMETHASONE SODIUM PHOSPHATE 10 MG/ML IJ SOLN
6.0000 mg | INTRAMUSCULAR | Status: DC
  Administered 2019-08-31 – 2019-09-04 (×5): 6 mg via INTRAVENOUS
  Filled 2019-08-31 (×5): qty 1

## 2019-08-31 MED ORDER — SODIUM CHLORIDE 0.9 % IV SOLN
100.0000 mg | Freq: Every day | INTRAVENOUS | Status: AC
  Administered 2019-09-01 – 2019-09-04 (×4): 100 mg via INTRAVENOUS
  Filled 2019-08-31 (×5): qty 20

## 2019-08-31 MED ORDER — SODIUM CHLORIDE 0.9 % IV SOLN
200.0000 mg | Freq: Once | INTRAVENOUS | Status: AC
  Administered 2019-08-31: 200 mg via INTRAVENOUS
  Filled 2019-08-31: qty 200
  Filled 2019-08-31: qty 40

## 2019-08-31 MED ORDER — ZINC SULFATE 220 (50 ZN) MG PO CAPS
220.0000 mg | ORAL_CAPSULE | Freq: Every day | ORAL | Status: DC
  Administered 2019-08-31 – 2019-09-04 (×5): 220 mg via ORAL
  Filled 2019-08-31 (×5): qty 1

## 2019-08-31 MED ORDER — LIDOCAINE 5 % EX PTCH
1.0000 | MEDICATED_PATCH | CUTANEOUS | Status: DC
  Administered 2019-08-31 – 2019-09-03 (×4): 1 via TRANSDERMAL
  Filled 2019-08-31 (×5): qty 1

## 2019-08-31 MED ORDER — ASCORBIC ACID 500 MG PO TABS
500.0000 mg | ORAL_TABLET | Freq: Every day | ORAL | Status: DC
  Administered 2019-08-31 – 2019-09-04 (×5): 500 mg via ORAL
  Filled 2019-08-31 (×5): qty 1

## 2019-08-31 NOTE — Progress Notes (Signed)
PROGRESS NOTE    Dustin Hines.  GYF:[id number] DOB: 02-19-1930 DOA: 08/27/2019 PCP: Javier Glazier, MD    Brief Narrative:  86 y.o. male with medical history significant of peripheral neuropathy, history of prostate cancer, GERD, prior duodenal ulcer, osteoporosis and ventral hernia who has had previous falls last year with left-sided rib fractures.  Patient started feeling dizzy about 3 days ago.  He usually is active walking around the neighborhood.  Lately his activities have been decreased with the COVID-19.  Patient noted the dizziness but has not had any shortness of breath or cough.  Today however he fell and had lacerations mainly on his left upper and lower extremities as well as the rib cage where he had previous rib fractures.  Wife is a retired [employer] scientist.  She came and helped him and dress his wounds.  She brought him to the ER for evaluation.  In the ER evaluation showed submassive pulmonary embolism.  Patient also mildly hypoxic.  He was seen at [facility] where he was initiated on IV heparin and sent over for further work-up.  He denied any fever or chills.  Denied any cough.  Denied any sick contacts.  COVID-19 screen done so far negative  Assessment & Plan:   Principal Problem:   Pulmonary embolism (HCC) Active Problems:   GERD (gastroesophageal reflux disease)   Fall at home, initial encounter  Submassive pulmonary embolism CTA chest showed acute bilateral occlusive PE with severe clot burden PESI score of 147 LE dopplers reviewed. Finding of LLE DVT in L femoral vein and L popliteal vein Echo showed EF of 60 to 91%, grade 1 diastolic dysfunction, no regional wall motion abnormality with no evidence of right heart strain S/p heparin gtt, switch to NOAC  COVID-19 pneumonia Chest x-ray showed bilateral interstitial and patchy alveolar airspace opacities concerning for multilobar pneumonia including atypical viral pneumonia, underlying mild chronic  interstitial lung disease Tested positive for COVID-19 on 08/30/2019.  The screening COVID-19 test on 08/27/2019 at the time of admission was negative. Continue ceftriaxone, azithromycin Start remdesivir, dexamethasone 08/31/2019 Trend inflammatory markers Multivitamins Incentive spirometer Albuterol inhaler  Acute hypoxic respiratory failure Requiring 3 L oxygen via nasal cannula.  Likely multifactorial in setting of Covid pneumonia, PE, questionable bacterial pneumonia Treatment as above  Positive urine culture Urine culture from 08/30/2019 growing Citrobacter koseri 50,000 CFU.  UA negative for nitrites, leukocyte esterase, positive for blood. Given improvement in fever with treatment of pneumonia, will hold off on antibiotics to cover this organism.  In case of persistent fevers, consider broadening antibiotic spectrum to cover this organism.  GERD Cont PPI  Fall Due to gait abnormalities.  Patient apparently has had recurrent falls at home.  At this point he has multiple bruises.  PT OT now consulted.  Lives at home with the wife.    DVT prophylaxis: Eliquis Code Status: Full Family Communication: Updated patient's wife via telephone Disposition Plan: PT/OT recommending SNF, needs inpatient stay for hypoxia requiring oxygen, PE and COVID-19 pneumonia   Consultants:   None  Procedures:   None  Antimicrobials: Anti-infectives (From admission, onward)   Start     Dose/Rate Route Frequency Ordered Stop   08/30/19 1530  azithromycin (ZITHROMAX) 500 mg in sodium chloride 0.9 % 250 mL IVPB     500 mg 250 mL/hr over 60 Minutes Intravenous Every 24 hours 08/30/19 1455     08/30/19 1500  cefTRIAXone (ROCEPHIN) 1 g in sodium chloride 0.9 % 100 mL  IVPB     1 g 200 mL/hr over 30 Minutes Intravenous Every 24 hours 08/30/19 1455          Subjective: Complains of pain when taking a deep breath.  Otherwise no complaints.    Objective: Vitals:   08/30/19 1200 08/30/19  1457 08/30/19 2006 08/31/19 0440  BP: 128/71 121/76 (!) 151/80 (!) 146/86  Pulse: 95 95 90 88  Resp: (!) '24 20 18 20  '$ Temp: 98.4 F (36.9 C) (!) 100.7 F (38.2 C) 100.2 F (37.9 C) 99.2 F (37.3 C)  TempSrc: Oral Oral Oral Oral  SpO2: 92% 91% (!) 89% 94%  Weight:  113.3 kg    Height:  6' (1.829 m)      Intake/Output Summary (Last 24 hours) at 08/31/2019 0821 Last data filed at 08/31/2019 0500 Gross per 24 hour  Intake 1619.87 ml  Output 700 ml  Net 919.87 ml   Filed Weights   08/27/19 0450 08/27/19 2100 08/30/19 1457  Weight: 114.3 kg 113 kg 113.3 kg    Examination:  General: NAD, very hard of hearing  Cardiovascular: S1, S2 present  Respiratory: CTAB  Abdomen: Soft, nontender, nondistended, bowel sounds present  Musculoskeletal: No bilateral pedal edema noted  Skin: Normal  Psychiatry: Normal mood   Data Reviewed: I have personally reviewed following labs and imaging studies  CBC: Recent Labs  Lab 08/27/19 0522 08/28/19 0018 08/29/19 0406 08/30/19 0458 08/31/19 0315  WBC 6.5 7.3 5.0 5.2 7.4  NEUTROABS 4.8  --   --   --   --   HGB 14.4 14.5 14.1 13.9 13.5  HCT 42.8 42.8 41.9 41.6 40.3  MCV 91.1 90.7 90.3 91.4 89.2  PLT 159 159 148* 142* 505*   Basic Metabolic Panel: Recent Labs  Lab 08/27/19 0522 08/28/19 0018 08/31/19 0315  NA 140 141 134*  K 3.9 3.6 4.1  CL 106 106 101  CO2 '26 28 23  '$ GLUCOSE 126* 110* 109*  BUN '16 13 18  '$ CREATININE 0.97 0.89 0.91  CALCIUM 8.7* 8.8* 8.4*   GFR: Estimated Creatinine Clearance: 74.3 mL/min (by C-G formula based on SCr of 0.91 mg/dL). Liver Function Tests: Recent Labs  Lab 08/28/19 0018  AST 22  ALT 15  ALKPHOS 70  BILITOT 0.8  PROT 6.2*  ALBUMIN 3.5   No results for input(s): LIPASE, AMYLASE in the last 168 hours. No results for input(s): AMMONIA in the last 168 hours. Coagulation Profile: No results for input(s): INR, PROTIME in the last 168 hours. Cardiac Enzymes: No results for input(s):  CKTOTAL, CKMB, CKMBINDEX, TROPONINI in the last 168 hours. BNP (last 3 results) No results for input(s): PROBNP in the last 8760 hours. HbA1C: No results for input(s): HGBA1C in the last 72 hours. CBG: No results for input(s): GLUCAP in the last 168 hours. Lipid Profile: No results for input(s): CHOL, HDL, LDLCALC, TRIG, CHOLHDL, LDLDIRECT in the last 72 hours. Thyroid Function Tests: No results for input(s): TSH, T4TOTAL, FREET4, T3FREE, THYROIDAB in the last 72 hours. Anemia Panel: Recent Labs    08/30/19 0726  FERRITIN 370*   Sepsis Labs: Recent Labs  Lab 08/31/19 0315  PROCALCITON 0.13    Recent Results (from the past 240 hour(s))  SARS Coronavirus 2 Ag (30 min TAT) - Nasal Swab (BD Veritor Kit)     Status: None   Collection Time: 08/27/19  5:22 AM   Specimen: Nasal Swab (BD Veritor Kit)  Result Value Ref Range Status   SARS Coronavirus 2  Ag NEGATIVE NEGATIVE Final    Comment: (NOTE) SARS-CoV-2 antigen NOT DETECTED.  Negative results are presumptive.  Negative results do not preclude SARS-CoV-2 infection and should not be used as the sole basis for treatment or other patient management decisions, including infection  control decisions, particularly in the presence of clinical signs and  symptoms consistent with COVID-19, or in those who have been in contact with the virus.  Negative results must be combined with clinical observations, patient history, and epidemiological information. The expected result is Negative. Fact Sheet for Patients: PodPark.tn Fact Sheet for Healthcare Providers: GiftContent.is This test is not yet approved or cleared by the [place] FDA and  has been authorized for detection and/or diagnosis of SARS-CoV-2 by FDA under an Emergency Use Authorization (EUA).  This EUA will remain in effect (meaning this test can be used) for the duration of  the COVID-19 de claration under Section  564(b)(1) of the Act, 21 U.S.C. section 360bbb-3(b)(1), unless the authorization is terminated or revoked sooner. Performed at [facility], [address] [place], [place] [address]   SARS CORONAVIRUS 2 (TAT 6-24 HRS) Nasopharyngeal Nasopharyngeal Swab     Status: None   Collection Time: 08/27/19  8:00 AM   Specimen: Nasopharyngeal Swab  Result Value Ref Range Status   SARS Coronavirus 2 NEGATIVE NEGATIVE Final    Comment: (NOTE) SARS-CoV-2 target nucleic acids are NOT DETECTED. The SARS-CoV-2 RNA is generally detectable in upper and lower respiratory specimens during the acute phase of infection. Negative results do not preclude SARS-CoV-2 infection, do not rule out co-infections with other pathogens, and should not be used as the sole basis for treatment or other patient management decisions. Negative results must be combined with clinical observations, patient history, and epidemiological information. The expected result is Negative. Fact Sheet for Patients: SugarRoll.be Fact Sheet for Healthcare Providers: https://www.woods-mathews.com/ This test is not yet approved or cleared by the [place] FDA and  has been authorized for detection and/or diagnosis of SARS-CoV-2 by FDA under an Emergency Use Authorization (EUA). This EUA will remain  in effect (meaning this test can be used) for the duration of the COVID-19 declaration under Section 56 4(b)(1) of the Act, 21 U.S.C. section 360bbb-3(b)(1), unless the authorization is terminated or revoked sooner. Performed at [facility], [place] [address] [place], [place] [address]   Urine Culture     Status: Abnormal (Preliminary result)   Collection Time: 08/30/19  7:06 AM   Specimen: Urine, Random  Result Value Ref Range Status   Specimen Description   Final    URINE, RANDOM Performed at [facility] [address] [place], [place] [address]     Special Requests   Final    NONE Performed at [facility], [place] [address], [place], [place] [address]    Culture 50,000 COLONIES/mL GRAM NEGATIVE RODS (A)  Final   Report Status PENDING  Incomplete  Culture, blood (routine x 2)     Status: None (Preliminary result)   Collection Time: 08/30/19  7:26 AM   Specimen: BLOOD RIGHT HAND  Result Value Ref Range Status   Specimen Description   Final    BLOOD RIGHT HAND Performed at [facility] [address], [place],  [address]    Special Requests   Final    BOTTLES DRAWN AEROBIC ONLY Blood Culture results may not be optimal due to an inadequate volume of blood received in culture bottles  Performed at [facility], [place] [address], [place], [place] [address]    Culture   Final    NO GROWTH < 24 HOURS Performed at [facility] [address], [place], [place] [address]    Report Status PENDING  Incomplete  Culture, blood (routine x 2)     Status: None (Preliminary result)   Collection Time: 08/30/19  7:26 AM   Specimen: BLOOD LEFT HAND  Result Value Ref Range Status   Specimen Description   Final    BLOOD LEFT HAND Performed at [facility] [address], [place], [place] [address]    Special Requests   Final    BOTTLES DRAWN AEROBIC ONLY Blood Culture adequate volume Performed at [facility] [address] [place], [place] [address]    Culture   Final    NO GROWTH < 24 HOURS Performed at [facility] [address] [place], [place] [address]    Report Status PENDING  Incomplete  MRSA PCR Screening     Status: None   Collection Time: 08/30/19  8:02 AM   Specimen: Nasopharyngeal Swab  Result Value Ref Range Status   MRSA by PCR NEGATIVE NEGATIVE Final    Comment:        The GeneXpert MRSA Assay (FDA approved for NASAL specimens only), is one component of a comprehensive MRSA colonization surveillance  program. It is not intended to diagnose MRSA infection nor to guide or monitor treatment for MRSA infections. Performed at [facility], [place] [address], [place], [place] [address]   SARS CORONAVIRUS 2 (TAT 6-24 HRS) Nasopharyngeal Nasopharyngeal Swab     Status: Abnormal   Collection Time: 08/30/19  2:44 PM   Specimen: Nasopharyngeal Swab  Result Value Ref Range Status   SARS Coronavirus 2 POSITIVE (A) NEGATIVE Final    Comment: RESULT CALLED TO, READ BACK BY AND VERIFIED WITH: RN Danley Danker AT 0025 BY MESSAN H. ON 08/31/2019 (NOTE) SARS-CoV-2 target nucleic acids are DETECTED. The SARS-CoV-2 RNA is generally detectable in upper and lower respiratory specimens during the acute phase of infection. Positive results are indicative of the presence of SARS-CoV-2 RNA. Clinical correlation with patient history and other diagnostic information is  necessary to determine patient infection status. Positive results do not rule out bacterial infection or co-infection with other viruses.  The expected result is Negative. Fact Sheet for Patients: SugarRoll.be Fact Sheet for Healthcare Providers: https://www.woods-mathews.com/ This test is not yet approved or cleared by the [place] FDA and  has been authorized for detection and/or diagnosis of SARS-CoV-2 by FDA under an Emergency Use Authorization (EUA). This EUA will remain  in effect (meaning this test can be  used) for the duration of the COVID-19 declaration under Section 564(b)(1) of the Act, 21 U.S.C. section 360bbb-3(b)(1), unless the authorization is terminated or revoked sooner. Performed at [facility], [place] [address] [place],  [address]      Radiology Studies: DG Chest Port 1 View  Result Date: 08/30/2019 CLINICAL DATA:  Fever, increased heart rate EXAM: PORTABLE CHEST 1 VIEW COMPARISON:  08/27/2019 FINDINGS: There is bilateral mild interstitial  thickening likely reflecting an element of underlying chronic interstitial disease. There are patchy alveolar airspace opacities bilaterally concerning for multilobar pneumonia. There is no pleural effusion or pneumothorax. The heart and mediastinal contours are unremarkable. There is no acute osseous abnormality. IMPRESSION: 1. Bilateral interstitial and patchy alveolar airspace opacities concerning for  multilobar pneumonia including atypical viral pneumonia. 2. Underlying mild chronic interstitial lung disease. Electronically Signed   By: Kathreen Devoid   On: 08/30/2019 08:07    Scheduled Meds: . apixaban  10 mg Oral BID   Followed by  . [START ON 09/06/2019] apixaban  5 mg Oral BID  . Chlorhexidine Gluconate Cloth  6 each Topical Daily  . cholecalciferol  5,000 Units Oral Daily  . [START ON 09/15/2019] cyanocobalamin  1,000 mcg Intramuscular Q30 days  . dexamethasone (DECADRON) injection  6 mg Intravenous Q24H  . DULoxetine  60 mg Oral QHS  . gabapentin  300 mg Oral BID  . mouth rinse  15 mL Mouth Rinse BID  . metoprolol tartrate  12.5 mg Oral BID  . pantoprazole  40 mg Oral Daily  . polyethylene glycol  17 g Oral Daily  . senna-docusate  1 tablet Oral BID   Continuous Infusions: . azithromycin Stopped (08/30/19 1700)  . cefTRIAXone (ROCEPHIN)  IV Stopped (08/30/19 1600)   Spent more than 30 minutes in coordinating care for this patient including bedside patient care.    LOS: 4 days   Lucky Cowboy, MD [facility] Pager On Amion  If 7PM-7AM, please contact night-coverage 08/31/2019, 8:21 AM

## 2019-08-31 NOTE — Progress Notes (Signed)
Patients wife Pleas Koch is requesting daily updates from nurse as well as MD and to use the land line number at [phone].

## 2019-09-01 LAB — URINE CULTURE: Culture: 50000 — AB

## 2019-09-01 LAB — COMPREHENSIVE METABOLIC PANEL
ALT: 31 U/L (ref 0–44)
AST: 38 U/L (ref 15–41)
Albumin: 3.3 g/dL — ABNORMAL LOW (ref 3.5–5.0)
Alkaline Phosphatase: 57 U/L (ref 38–126)
Anion gap: 10 (ref 5–15)
BUN: 25 mg/dL — ABNORMAL HIGH (ref 8–23)
CO2: 23 mmol/L (ref 22–32)
Calcium: 8.8 mg/dL — ABNORMAL LOW (ref 8.9–10.3)
Chloride: 106 mmol/L (ref 98–111)
Creatinine, Ser: 0.8 mg/dL (ref 0.61–1.24)
GFR calc Af Amer: >60 mL/min (ref >60)
GFR calc non Af Amer: >60 mL/min (ref >60)
Glucose, Bld: 111 mg/dL — ABNORMAL HIGH (ref 70–99)
Potassium: 4.4 mmol/L (ref 3.5–5.1)
Sodium: 139 mmol/L (ref 135–145)
Total Bilirubin: 0.5 mg/dL (ref 0.3–1.2)
Total Protein: 6.2 g/dL — ABNORMAL LOW (ref 6.5–8.1)

## 2019-09-01 LAB — D-DIMER, QUANTITATIVE: D-Dimer, Quant: 2.45 ug/mL-FEU — ABNORMAL HIGH (ref 0.00–0.50)

## 2019-09-01 LAB — C-REACTIVE PROTEIN: CRP: 5.5 mg/dL — ABNORMAL HIGH (ref <1.0)

## 2019-09-01 LAB — FERRITIN: Ferritin: 523 ng/mL — ABNORMAL HIGH (ref 24–336)

## 2019-09-01 MED ORDER — MAGNESIUM CITRATE PO SOLN
1.0000 | Freq: Once | ORAL | Status: AC
  Administered 2019-09-01: 13:00:00 1 via ORAL

## 2019-09-01 NOTE — Progress Notes (Signed)
PROGRESS NOTE    Dustin Apodaca Sr.  CHY:[id number] DOB: [birth year] DOA: 08/27/2019 PCP: Javier Glazier, MD    Brief Narrative:  86 y.o. male with medical history significant of peripheral neuropathy, history of prostate cancer, GERD, prior duodenal ulcer, osteoporosis and ventral hernia who has had previous falls last year with left-sided rib fractures presented with dizziness and fall, found to have multiple PEs with severe burden.  Started on anticoagulation and subsequently switched to Eliquis.  Initial COVID-19 screen test was negative but due to persistent fever spikes and chest x-ray findings concerning for multilobar pneumonia, repeat Covid test was done on 12/20 which came back positive.  Since he is on remdesivir, dexamethasone and was also started on ceftriaxone, azithromycin for possible underlying bacterial pneumonia.  Worked with PT/OT who recommended SNF for short-term rehab.  Assessment & Plan:   Principal Problem:   Pulmonary embolism (HCC) Active Problems:   GERD (gastroesophageal reflux disease)   Fall at home, initial encounter  Submassive pulmonary embolism CTA chest showed acute bilateral occlusive PE with severe clot burden PESI score of 147 LE dopplers reviewed. Finding of LLE DVT in L femoral vein and L popliteal vein Echo showed EF of 60 to 87%, grade 1 diastolic dysfunction, no regional wall motion abnormality with no evidence of right heart strain S/p heparin gtt, switch to NOAC  COVID-19 pneumonia Chest x-ray showed bilateral interstitial and patchy alveolar airspace opacities concerning for multilobar pneumonia including atypical viral pneumonia, underlying mild chronic interstitial lung disease Tested positive for COVID-19 on 08/30/2019.  The screening COVID-19 test on 08/27/2019 at the time of admission was negative. Continue ceftriaxone, azithromycin Start remdesivir (end 1/25) , dexamethasone  Trend inflammatory markers Multivitamins Incentive  spirometer Albuterol inhaler  Acute hypoxic respiratory failure Requiring 3 L oxygen via nasal cannula.  Likely multifactorial in setting of Covid pneumonia, PE, questionable bacterial pneumonia Treatment as above  Positive urine culture Urine culture from 08/30/2019 growing Citrobacter koseri 50,000 CFU.  UA negative for nitrites, leukocyte esterase, positive for blood. Given improvement in fever with treatment of pneumonia, will hold off on antibiotics to cover this organism.  In case of persistent fevers, consider broadening antibiotic spectrum to cover this organism.  GERD Cont PPI  Fall Due to gait abnormalities.  Patient apparently has had recurrent falls at home.  At this point he has multiple bruises.  PT OT recommend SNF for rehab.  Lives at ALF with the wife.    DVT prophylaxis: Eliquis Code Status: Full Family Communication: Updated patient's wife via telephone Disposition Plan: PT/OT recommending SNF, tentative plan was for discharge tomorrow to SNF but unfortunately his SNF is not able to provide transport for remdesivir infusions and therefore he may need to stay inpatient to complete his 5 days.  Consultants:   None  Procedures:   None  Antimicrobials: Anti-infectives (From admission, onward)   Start     Dose/Rate Route Frequency Ordered Stop   09/01/19 1000  remdesivir 100 mg in sodium chloride 0.9 % 100 mL IVPB     100 mg 200 mL/hr over 30 Minutes Intravenous Daily 08/31/19 0838 09/05/19 0959   08/31/19 1000  remdesivir 200 mg in sodium chloride 0.9% 250 mL IVPB     200 mg 580 mL/hr over 30 Minutes Intravenous Once 08/31/19 0838 08/31/19 1137   08/30/19 1530  azithromycin (ZITHROMAX) 500 mg in sodium chloride 0.9 % 250 mL IVPB     500 mg 250 mL/hr over 60 Minutes Intravenous Every 24 hours  08/30/19 1455 09/03/19 2359   08/30/19 1500  cefTRIAXone (ROCEPHIN) 1 g in sodium chloride 0.9 % 100 mL IVPB     1 g 200 mL/hr over 30 Minutes Intravenous Every 24  hours 08/30/19 1455 09/03/19 2359        Subjective: Complains of pain when taking a deep breath.  Otherwise no complaints.    Objective: Vitals:   08/31/19 0440 08/31/19 1203 08/31/19 2139 09/01/19 0510  BP: (!) 146/86 130/82 133/89 127/80  Pulse: 88 72 76 66  Resp: '20 18 20 18  '$ Temp: 99.2 F (37.3 C) 99.1 F (37.3 C) 98.5 F (36.9 C) 98 F (36.7 C)  TempSrc: Oral Oral Oral Oral  SpO2: 94% 97% 96% 94%  Weight:      Height:        Intake/Output Summary (Last 24 hours) at 09/01/2019 1541 Last data filed at 09/01/2019 0900 Gross per 24 hour  Intake 240 ml  Output 1075 ml  Net -835 ml   Filed Weights   08/27/19 0450 08/27/19 2100 08/30/19 1457  Weight: 114.3 kg 113 kg 113.3 kg    Examination:  General: NAD, very hard of hearing  Cardiovascular: S1, S2 present  Respiratory: CTAB  Abdomen: Soft, nontender, nondistended, bowel sounds present  Musculoskeletal: No bilateral pedal edema noted  Skin: left leg dressing present  Psychiatry: Normal mood   Data Reviewed: I have personally reviewed following labs and imaging studies  CBC: Recent Labs  Lab 08/27/19 0522 08/28/19 0018 08/29/19 0406 08/30/19 0458 08/31/19 0315  WBC 6.5 7.3 5.0 5.2 7.4  NEUTROABS 4.8  --   --   --   --   HGB 14.4 14.5 14.1 13.9 13.5  HCT 42.8 42.8 41.9 41.6 40.3  MCV 91.1 90.7 90.3 91.4 89.2  PLT 159 159 148* 142* 073*   Basic Metabolic Panel: Recent Labs  Lab 08/27/19 0522 08/28/19 0018 08/31/19 0315 09/01/19 0313  NA 140 141 134* 139  K 3.9 3.6 4.1 4.4  CL 106 106 101 106  CO2 '26 28 23 23  '$ GLUCOSE 126* 110* 109* 111*  BUN '16 13 18 '$ 25*  CREATININE 0.97 0.89 0.91 0.80  CALCIUM 8.7* 8.8* 8.4* 8.8*   GFR: Estimated Creatinine Clearance: 84.6 mL/min (by C-G formula based on SCr of 0.8 mg/dL). Liver Function Tests: Recent Labs  Lab 08/28/19 0018 09/01/19 0313  AST 22 38  ALT 15 31  ALKPHOS 70 57  BILITOT 0.8 0.5  PROT 6.2* 6.2*  ALBUMIN 3.5 3.3*   No  results for input(s): LIPASE, AMYLASE in the last 168 hours. No results for input(s): AMMONIA in the last 168 hours. Coagulation Profile: No results for input(s): INR, PROTIME in the last 168 hours. Cardiac Enzymes: No results for input(s): CKTOTAL, CKMB, CKMBINDEX, TROPONINI in the last 168 hours. BNP (last 3 results) No results for input(s): PROBNP in the last 8760 hours. HbA1C: No results for input(s): HGBA1C in the last 72 hours. CBG: No results for input(s): GLUCAP in the last 168 hours. Lipid Profile: No results for input(s): CHOL, HDL, LDLCALC, TRIG, CHOLHDL, LDLDIRECT in the last 72 hours. Thyroid Function Tests: No results for input(s): TSH, T4TOTAL, FREET4, T3FREE, THYROIDAB in the last 72 hours. Anemia Panel: Recent Labs    08/30/19 0726 09/01/19 0313  FERRITIN 370* 523*   Sepsis Labs: Recent Labs  Lab 08/31/19 0315  PROCALCITON 0.13    Recent Results (from the past 240 hour(s))  SARS Coronavirus 2 Ag (30 min TAT) - Nasal  Swab (BD Veritor Kit)     Status: None   Collection Time: 08/27/19  5:22 AM   Specimen: Nasal Swab (BD Veritor Kit)  Result Value Ref Range Status   SARS Coronavirus 2 Ag NEGATIVE NEGATIVE Final    Comment: (NOTE) SARS-CoV-2 antigen NOT DETECTED.  Negative results are presumptive.  Negative results do not preclude SARS-CoV-2 infection and should not be used as the sole basis for treatment or other patient management decisions, including infection  control decisions, particularly in the presence of clinical signs and  symptoms consistent with COVID-19, or in those who have been in contact with the virus.  Negative results must be combined with clinical observations, patient history, and epidemiological information. The expected result is Negative. Fact Sheet for Patients: PodPark.tn Fact Sheet for Healthcare Providers: GiftContent.is This test is not yet approved or cleared by the  [place] FDA and  has been authorized for detection and/or diagnosis of SARS-CoV-2 by FDA under an Emergency Use Authorization (EUA).  This EUA will remain in effect (meaning this test can be used) for the duration of  the COVID-19 de claration under Section 564(b)(1) of the Act, 21 U.S.C. section 360bbb-3(b)(1), unless the authorization is terminated or revoked sooner. Performed at [facility], [address] [place], [place] [address]   SARS CORONAVIRUS 2 (TAT 6-24 HRS) Nasopharyngeal Nasopharyngeal Swab     Status: None   Collection Time: 08/27/19  8:00 AM   Specimen: Nasopharyngeal Swab  Result Value Ref Range Status   SARS Coronavirus 2 NEGATIVE NEGATIVE Final    Comment: (NOTE) SARS-CoV-2 target nucleic acids are NOT DETECTED. The SARS-CoV-2 RNA is generally detectable in upper and lower respiratory specimens during the acute phase of infection. Negative results do not preclude SARS-CoV-2 infection, do not rule out co-infections with other pathogens, and should not be used as the sole basis for treatment or other patient management decisions. Negative results must be combined with clinical observations, patient history, and epidemiological information. The expected result is Negative. Fact Sheet for Patients: SugarRoll.be Fact Sheet for Healthcare Providers: https://www.woods-mathews.com/ This test is not yet approved or cleared by the [place] FDA and  has been authorized for detection and/or diagnosis of SARS-CoV-2 by FDA under an Emergency Use Authorization (EUA). This EUA will remain  in effect (meaning this test can be used) for the duration of the COVID-19 declaration under Section 56 4(b)(1) of the Act, 21 U.S.C. section 360bbb-3(b)(1), unless the authorization is terminated or revoked sooner. Performed at [facility], [place] [address] [place], [place] [address]   Urine Culture     Status:  Abnormal   Collection Time: 08/30/19  7:06 AM   Specimen: Urine, Random  Result Value Ref Range Status   Specimen Description   Final    URINE, RANDOM Performed at [place] [address] [place], [place] [address]    Special Requests   Final    NONE Performed at [facility], [place] [address], [place],  [address]    Culture 50,000 COLONIES/mL CITROBACTER KOSERI (A)  Final   Report Status 09/01/2019 FINAL  Final   Organism ID, Bacteria CITROBACTER KOSERI (A)  Final      Susceptibility   Citrobacter koseri - MIC*    CEFAZOLIN <=4 SENSITIVE Sensitive     CEFTRIAXONE <=0.25 SENSITIVE Sensitive     CIPROFLOXACIN <=0.25 SENSITIVE Sensitive     GENTAMICIN <=1 SENSITIVE Sensitive     IMIPENEM <=  0.25 SENSITIVE Sensitive     NITROFURANTOIN <=16 SENSITIVE Sensitive     TRIMETH/SULFA <=20 SENSITIVE Sensitive     PIP/TAZO <=4 SENSITIVE Sensitive     * 50,000 COLONIES/mL CITROBACTER KOSERI  Culture, blood (routine x 2)     Status: None (Preliminary result)   Collection Time: 08/30/19  7:26 AM   Specimen: BLOOD RIGHT HAND  Result Value Ref Range Status   Specimen Description   Final    BLOOD RIGHT HAND Performed at [place] [address] [place], [place] [address]    Special Requests   Final    BOTTLES DRAWN AEROBIC ONLY Blood Culture results may not be optimal due to an inadequate volume of blood received in culture bottles Performed at [facility] [address], [place], [place] [address]    Culture   Final    NO GROWTH 2 DAYS Performed at [facility] [address], [place], [place] [address]    Report Status PENDING  Incomplete  Culture, blood (routine x 2)     Status: None (Preliminary result)   Collection Time: 08/30/19  7:26 AM   Specimen: BLOOD LEFT HAND  Result Value Ref Range Status   Specimen Description   Final    BLOOD LEFT HAND Performed at [place] [address] [place], [place] [address]    Special Requests   Final    BOTTLES DRAWN AEROBIC ONLY Blood Culture adequate volume Performed at [facility] [address] [place], [place] [address]    Culture   Final    NO GROWTH 2 DAYS Performed at [facility] [address] [place], [place] [address]    Report Status PENDING  Incomplete  MRSA PCR Screening     Status: None   Collection Time: 08/30/19  8:02 AM   Specimen: Nasopharyngeal Swab  Result Value Ref Range Status   MRSA by PCR NEGATIVE NEGATIVE Final    Comment:        The GeneXpert MRSA Assay (FDA approved for NASAL specimens only), is one component of a comprehensive MRSA colonization surveillance program. It is not intended to diagnose MRSA infection nor to guide or monitor treatment for MRSA infections. Performed at [facility], [place] [address] [place], [place] [address]   SARS CORONAVIRUS 2 (TAT 6-24 HRS) Nasopharyngeal Nasopharyngeal Swab     Status: Abnormal   Collection Time: 08/30/19  2:44 PM   Specimen: Nasopharyngeal Swab  Result Value Ref Range Status   SARS Coronavirus 2 POSITIVE (A) NEGATIVE Final    Comment: RESULT CALLED TO, READ BACK BY AND VERIFIED WITH: RN Danley Danker AT 0025 BY MESSAN H. ON 08/31/2019 (NOTE) SARS-CoV-2 target nucleic acids are DETECTED. The SARS-CoV-2 RNA is generally detectable in upper and lower respiratory specimens during the acute phase of infection. Positive results are indicative of the presence of SARS-CoV-2 RNA. Clinical correlation with patient history and other diagnostic information is  necessary to determine patient infection status. Positive results do not rule out bacterial infection or co-infection with other viruses.  The expected result is Negative. Fact Sheet for Patients: SugarRoll.be Fact Sheet for Healthcare  Providers: https://www.woods-mathews.com/ This test is not yet approved or cleared by the [place] FDA and  has been authorized for detection and/or diagnosis of SARS-CoV-2 by FDA under an Emergency Use Authorization (EUA). This EUA will remain  in effect (meaning this test can be  used)  for the duration of the COVID-19 declaration under Section 564(b)(1) of the Act, 21 U.S.C. section 360bbb-3(b)(1), unless the authorization is terminated or revoked sooner. Performed at [facility], [place] [address] [place], [place] [address]      Radiology Studies: No results found.  Scheduled Meds: . albuterol  1-2 puff Inhalation Q6H  . apixaban  10 mg Oral BID   Followed by  . [START ON 09/06/2019] apixaban  5 mg Oral BID  . vitamin C  500 mg Oral Daily  . Chlorhexidine Gluconate Cloth  6 each Topical Daily  . cholecalciferol  5,000 Units Oral Daily  . [START ON 09/15/2019] cyanocobalamin  1,000 mcg Intramuscular Q30 days  . dexamethasone (DECADRON) injection  6 mg Intravenous Q24H  . DULoxetine  60 mg Oral QHS  . gabapentin  300 mg Oral BID  . lidocaine  1 patch Transdermal Q24H  . mouth rinse  15 mL Mouth Rinse BID  . metoprolol tartrate  12.5 mg Oral BID  . pantoprazole  40 mg Oral Daily  . polyethylene glycol  17 g Oral Daily  . senna-docusate  1 tablet Oral BID  . zinc sulfate  220 mg Oral Daily   Continuous Infusions: . azithromycin 500 mg (09/01/19 1505)  . cefTRIAXone (ROCEPHIN)  IV 1 g (08/31/19 1516)  . remdesivir 100 mg in NS 100 mL 100 mg (09/01/19 0926)   Spent more than 30 minutes in coordinating care for this patient including bedside patient care.    LOS: 5 days   Lucky Cowboy, MD [facility] Pager On Amion  If 7PM-7AM, please contact night-coverage 09/01/2019, 3:41 PM

## 2019-09-01 NOTE — Progress Notes (Signed)
Spoke with patient spouse and updated.

## 2019-09-01 NOTE — NC FL2 (Signed)
[place] LEVEL OF CARE SCREENING TOOL     IDENTIFICATION  Patient Name: Dustin Campe Sr. Birthdate: March 23, 1930 Sex: male Admission Date (Current Location): 08/27/2019  [facility] [place] Number:  Herbalist and Address:  [facility],  [place] [address], [place]      Provider Number: [id number]  Attending Physician Name and Address:  Lucky Cowboy, MD  Relative Name and Phone Number:       Current Level of Care: Hospital Recommended Level of Care: [place] Prior Approval Number:    Date Approved/Denied:   PASRR Number: [id number]  Discharge Plan: SNF    Current Diagnoses: Patient Active Problem List   Diagnosis Date Noted  . GERD (gastroesophageal reflux disease) 08/28/2019  . Fall at home, initial encounter 08/28/2019  . Pulmonary embolism ([place]) 08/27/2019  . Multiple rib fractures 01/18/2018  . Liver laceration, grade I 01/18/2018    Orientation RESPIRATION BLADDER Height & Weight     Self, Time, Situation, Place  O2(3L O2) External catheter, Continent Weight: 113.3 kg Height:  6' (182.9 cm)  BEHAVIORAL SYMPTOMS/MOOD NEUROLOGICAL BOWEL NUTRITION STATUS      Continent Diet(Heart Healthy)  AMBULATORY STATUS COMMUNICATION OF NEEDS Skin   Limited Assist Verbally Skin abrasions(Abrasion, ecchymosis, skin fear, flaky skin, arms, legs and flank)                       Personal Care Assistance Level of Assistance  Bathing, Feeding, Dressing Bathing Assistance: Limited assistance Feeding assistance: Independent Dressing Assistance: Limited assistance     Functional Limitations Info  Sight, Hearing, Speech Sight Info: Adequate Hearing Info: Adequate Speech Info: Adequate    SPECIAL CARE FACTORS FREQUENCY                       Contractures Contractures Info: Not present    Additional Factors Info  Code Status, Allergies Code Status Info: FULL Allergies Info: Hydrocodone            Current Medications (09/01/2019):  This is the current hospital active medication list Current Facility-Administered Medications  Medication Dose Route Frequency Provider Last Rate Last Admin  . acetaminophen (TYLENOL) tablet 650 mg  650 mg Oral Q6H PRN Donne Hazel, MD   650 mg at 08/29/19 2332   Or  . acetaminophen (TYLENOL) suppository 650 mg  650 mg Rectal Q6H PRN Donne Hazel, MD      . albuterol (VENTOLIN HFA) 108 (90 Base) MCG/ACT inhaler 1-2 puff  1-2 puff Inhalation Q6H Lucky Cowboy, MD   2 puff at 09/01/19 1400  . apixaban (ELIQUIS) tablet 10 mg  10 mg Oral BID Polly Cobia, RPH   10 mg at 09/01/19 [id number]   Followed by  . [START ON 09/06/2019] apixaban (ELIQUIS) tablet 5 mg  5 mg Oral BID Polly Cobia, RPH      . ascorbic acid (VITAMIN C) tablet 500 mg  500 mg Oral Daily Lucky Cowboy, MD   500 mg at 09/01/19 [id number]  . azithromycin (ZITHROMAX) 500 mg in sodium chloride 0.9 % 250 mL IVPB  500 mg Intravenous Q24H Alma Friendly, MD 250 mL/hr at 09/01/19 1505 500 mg at 09/01/19 1505  . cefTRIAXone (ROCEPHIN) 1 g in sodium chloride 0.9 % 100 mL IVPB  1 g Intravenous Q24H Alma Friendly, MD 200 mL/hr at 08/31/19 1516 1 g at 08/31/19 1516  . Chlorhexidine Gluconate  Cloth 2 % PADS 6 each  6 each Topical Daily Donne Hazel, MD   6 each at 08/29/19 [phone]  . cholecalciferol (VITAMIN D) tablet 5,000 Units  5,000 Units Oral Daily Donne Hazel, MD   5,000 Units at 09/01/19 [phone]  . [START ON 09/15/2019] cyanocobalamin ((VITAMIN B-12)) injection 1,000 mcg  1,000 mcg Intramuscular Q30 days Donne Hazel, MD      . dexamethasone (DECADRON) injection 6 mg  6 mg Intravenous Q24H Lucky Cowboy, MD   6 mg at 09/01/19 0916  . DULoxetine (CYMBALTA) DR capsule 60 mg  60 mg Oral QHS Donne Hazel, MD   60 mg at 08/31/19 2253  . gabapentin (NEURONTIN) capsule 300 mg  300 mg Oral BID Donne Hazel, MD   300 mg at 09/01/19 0916  . lidocaine (LIDODERM) 5 % 1 patch  1 patch  Transdermal Q24H Lucky Cowboy, MD   1 patch at 09/01/19 1200  . MEDLINE mouth rinse  15 mL Mouth Rinse BID Donne Hazel, MD   15 mL at 09/01/19 0917  . metoprolol tartrate (LOPRESSOR) tablet 12.5 mg  12.5 mg Oral BID Alma Friendly, MD   12.5 mg at 09/01/19 0916  . morphine 2 MG/ML injection 2 mg  2 mg Intravenous Q4H PRN Donne Hazel, MD   2 mg at 08/30/19 0941  . ondansetron (ZOFRAN) tablet 4 mg  4 mg Oral Q6H PRN Donne Hazel, MD       Or  . ondansetron [facility]) injection 4 mg  4 mg Intravenous Q6H PRN Donne Hazel, MD      . pantoprazole (PROTONIX) EC tablet 40 mg  40 mg Oral Daily Donne Hazel, MD   40 mg at 09/01/19 0916  . phenol (CHLORASEPTIC) mouth spray 1 spray  1 spray Mouth/Throat PRN Donne Hazel, MD   1 spray at 08/30/19 1145  . polyethylene glycol (MIRALAX / GLYCOLAX) packet 17 g  17 g Oral Daily Alma Friendly, MD   17 g at 08/30/19 1524  . remdesivir 100 mg in sodium chloride 0.9 % 100 mL IVPB  100 mg Intravenous Daily Angela Adam, RPH 200 mL/hr at 09/01/19 0926 100 mg at 09/01/19 0926  . senna-docusate (Senokot-S) tablet 1 tablet  1 tablet Oral BID Alma Friendly, MD   1 tablet at 09/01/19 [id number]  . zinc sulfate capsule 220 mg  220 mg Oral Daily Lucky Cowboy, MD   220 mg at 09/01/19 [id number]  . zolpidem (AMBIEN) tablet 2.5 mg  2.5 mg Oral QHS PRN Lang Snow, FNP   2.5 mg at 08/29/19 2218     Discharge Medications: Please see discharge summary for a list of discharge medications.  Relevant Imaging Results:  Relevant Lab Results:   Additional Information SS# [id number]  Purcell Mouton, RN

## 2019-09-01 NOTE — Progress Notes (Signed)
Physical Therapy  Spoke with pt's wife and updated on mobility, assist level and gait distance.  Rica Koyanagi  PTA Acute  Rehabilitation Services Pager      [phone] Office      [phone]

## 2019-09-01 NOTE — TOC Progression Note (Signed)
Transition of Care (TOC) - Progression Note    Patient Details  Name: Petar Redder Sr. MRN: [id number] Date of Birth: [birth year]  Transition of Care [facility]) CM/SW Contact  Purcell Mouton, RN Phone Number: 09/01/2019, 3:52 PM  Clinical Narrative:    Hulen Skains to [facility] [id number]. Plan to go to [facility].    Expected Discharge Plan: [place] Barriers to Discharge: No Barriers Identified  Expected Discharge Plan and Services Expected Discharge Plan: [place] arrangements for the past 2 months: Independent Living Facility([facility] [place])                                       Social Determinants of Health (SDOH) Interventions    Readmission Risk Interventions No flowsheet data found.

## 2019-09-01 NOTE — Progress Notes (Signed)
Physical Therapy Treatment Patient Details Name: Dustin Hines. MRN: [id number] DOB: 01-24-1930 Today's Date: 09/01/2019    History of Present Illness 86 year old man admitted with dizziness and fall. Found to have large PE and heart strain L LE DVT.  PMH:  h/o rib fxs, peripheral neuropathy, DM prostate CA.    PT Comments    Pt in bed on 3 lts nasal at 96%.  Assisted OOB to amb was very difficult.  General bed mobility comments: assist for LLE and trunk.  Used rails to assist. Also assisted with repositioning in bed using rails. Increased time. Multimodal cueing required. Used bed pad to complete scooting.  Difficult self achieving upright/midline posture.  Had to sit EOB x 7 min before attempting sit to stand due to posterior lean. General transfer comment: Assist to rise, stabilize, control descent. VCs safety, technique.  Pt exhibits a "lift chair" behavior.  75% VC's to push up from bed/recl;iner vs pukll up on walker and present with uncontrolled desend. General Gait Details: VERY unsteady gait with LOB posterior/LEFT with poor ability to self correct.  Limited distance due to weakness.  Too unsteady for his 4WW.  Will use Bariatric RW next visit.  Pt will need ST Rehab at SNF prior to returning home with spouse  Follow Up Recommendations  SNF     Equipment Recommendations  None recommended by PT    Recommendations for Other Services       Precautions / Restrictions Precautions Precautions: Fall Precaution Comments: L rib/flank pain from fall Restrictions Weight Bearing Restrictions: No    Mobility  Bed Mobility Overal bed mobility: Needs Assistance Bed Mobility: Supine to Sit     Supine to sit: Max assist;Total assist     General bed mobility comments: assist for LLE and trunk.  Used rails to assist. Also assisted with repositioning in bed using rails. Increased time. Multimodal cueing required. Used bed pad to complete scooting.  Difficult self achieving  upright/midline posture.  Had to sit EOB x 7 min before attempting sit to stand due to posterior lean.  Transfers Overall transfer level: Needs assistance Equipment used: 4-wheeled walker Transfers: Sit to/from Stand Sit to Stand: Mod assist         General transfer comment: Assist to rise, stabilize, control descent. VCs safety, technique.  Pt exhibits a "lift chair" behavior.  75% VC's to push up from bed/recl;iner vs pukll up on walker and present with uncontrolled desend.  Ambulation/Gait Ambulation/Gait assistance: Mod assist;Max assist Gait Distance (Feet): 17 Feet(5',4', 8' seated rest breaks between) Assistive device: 4-wheeled walker Gait Pattern/deviations: Step-to pattern;Staggering left;Leaning posteriorly Gait velocity: decreased   General Gait Details: VERY unsteady gait with LOB posterior/LEFT with poor ability to self correct.  Limited distance due to weakness.  Too unsteady for his 4WW.  Will use Bariatric RW next visit.   Stairs             Wheelchair Mobility    Modified Rankin (Stroke Patients Only)       Balance                                            Cognition Arousal/Alertness: Awake/alert Behavior During Therapy: WFL for tasks assessed/performed Overall Cognitive Status: Impaired/Different from baseline Area of Impairment: Memory;Safety/judgement;Problem solving  Following Commands: Follows one step commands with increased time     Problem Solving: Difficulty sequencing;Requires verbal cues;Requires tactile cues General Comments: required repeat instruction also he is HOH.  AxO x 2 1/2      Exercises      General Comments        Pertinent Vitals/Pain Pain Assessment: Faces Faces Pain Scale: Hurts little more Pain Location: ribs when coughing, back, legs and feet Pain Descriptors / Indicators: Discomfort;Aching;Sore Pain Intervention(s): Monitored during session;Repositioned     Home Living                      Prior Function            PT Goals (current goals can now be found in the care plan section) Progress towards PT goals: Progressing toward goals    Frequency    Min 3X/week      PT Plan Current plan remains appropriate    Co-evaluation              AM-PAC PT "6 Clicks" Mobility   Outcome Measure  Help needed turning from your back to your side while in a flat bed without using bedrails?: A Lot Help needed moving from lying on your back to sitting on the side of a flat bed without using bedrails?: A Lot Help needed moving to and from a bed to a chair (including a wheelchair)?: A Lot Help needed standing up from a chair using your arms (e.g., wheelchair or bedside chair)?: A Lot Help needed to walk in hospital room?: A Lot Help needed climbing 3-5 steps with a railing? : Total 6 Click Score: 11    End of Session Equipment Utilized During Treatment: Gait belt Activity Tolerance: Patient limited by fatigue Patient left: in chair;with call bell/phone within reach;with chair alarm set Nurse Communication: Mobility status(RN observed) PT Visit Diagnosis: Muscle weakness (generalized) (M62.81);Difficulty in walking, not elsewhere classified (R26.2);Pain;History of falling (Z91.81);Repeated falls (R29.6)     Time: [id number] PT Time Calculation (min) (ACUTE ONLY): 55 min  Charges:  $Gait Training: 23-37 mins $Therapeutic Activity: 23-37 mins                     Rica Koyanagi  PTA Acute  Rehabilitation Services Pager      [phone] Office      [phone]

## 2019-09-02 DIAGNOSIS — J9601 Acute respiratory failure with hypoxia: Secondary | ICD-10-CM

## 2019-09-02 DIAGNOSIS — J9600 Acute respiratory failure, unspecified whether with hypoxia or hypercapnia: Secondary | ICD-10-CM

## 2019-09-02 DIAGNOSIS — G9341 Metabolic encephalopathy: Secondary | ICD-10-CM

## 2019-09-02 DIAGNOSIS — J1282 Pneumonia due to coronavirus disease 2019: Secondary | ICD-10-CM

## 2019-09-02 DIAGNOSIS — I10 Essential (primary) hypertension: Secondary | ICD-10-CM

## 2019-09-02 LAB — CBC
HCT: 39.3 % (ref 39.0–52.0)
Hemoglobin: 12.9 g/dL — ABNORMAL LOW (ref 13.0–17.0)
MCH: 29.6 pg (ref 26.0–34.0)
MCHC: 32.8 g/dL (ref 30.0–36.0)
MCV: 90.1 fL (ref 80.0–100.0)
Platelets: 168 10*3/uL (ref 150–400)
RBC: 4.36 MIL/uL (ref 4.22–5.81)
RDW: 13.8 % (ref 11.5–15.5)
WBC: 6.2 10*3/uL (ref 4.0–10.5)
nRBC: 0 % (ref 0.0–0.2)

## 2019-09-02 LAB — COMPREHENSIVE METABOLIC PANEL
ALT: 27 U/L (ref 0–44)
AST: 31 U/L (ref 15–41)
Albumin: 3.1 g/dL — ABNORMAL LOW (ref 3.5–5.0)
Alkaline Phosphatase: 54 U/L (ref 38–126)
Anion gap: 8 (ref 5–15)
BUN: 31 mg/dL — ABNORMAL HIGH (ref 8–23)
CO2: 28 mmol/L (ref 22–32)
Calcium: 8.8 mg/dL — ABNORMAL LOW (ref 8.9–10.3)
Chloride: 103 mmol/L (ref 98–111)
Creatinine, Ser: 0.83 mg/dL (ref 0.61–1.24)
GFR calc Af Amer: >60 mL/min (ref >60)
GFR calc non Af Amer: >60 mL/min (ref >60)
Glucose, Bld: 106 mg/dL — ABNORMAL HIGH (ref 70–99)
Potassium: 4.1 mmol/L (ref 3.5–5.1)
Sodium: 139 mmol/L (ref 135–145)
Total Bilirubin: 0.5 mg/dL (ref 0.3–1.2)
Total Protein: 5.8 g/dL — ABNORMAL LOW (ref 6.5–8.1)

## 2019-09-02 LAB — D-DIMER, QUANTITATIVE: D-Dimer, Quant: 1.99 ug/mL-FEU — ABNORMAL HIGH (ref 0.00–0.50)

## 2019-09-02 LAB — C-REACTIVE PROTEIN: CRP: 2 mg/dL — ABNORMAL HIGH (ref <1.0)

## 2019-09-02 LAB — FERRITIN: Ferritin: 470 ng/mL — ABNORMAL HIGH (ref 24–336)

## 2019-09-02 NOTE — Progress Notes (Signed)
Physical Therapy Treatment Patient Details Name: Dustin Pomrenke Sr. MRN: [id number] DOB: 18-Mar-1930 Today's Date: 09/02/2019    History of Present Illness 86 year old man admitted with dizziness and fall. Found to have large PE and heart strain L LE DVT.  PMH:  h/o rib fxs, peripheral neuropathy, DM prostate CA.    PT Comments    Pt unable to recall walking with me yesterday.  Spouse stated pt does have mild dementia/forgetfullness.  Assisted OOB to [facility] first.  General bed mobility comments: improved ability to self rise with HOB elevated and use of rails. General transfer comment: 50% VC's on proper hand placement to push up from recliner vs pull up on walker as well as 75% VC's on turn completion and target prior to sit.  Pt amb 22 feet around room (11 feet x 2 one seated rest break. General Gait Details: somewhat improved gaiy today using Bariatric RW vs his Rollator.  50% VC's on upright posture and proper walker to self distance.  Recliner following closely behind due to L knee buckle. Monitored RA.   SATURATION QUALIFICATIONS: (This note is used to comply with regulatory documentation for home oxygen)  Patient Saturations on Room Air at Rest = *92%  Patient Saturations on Room Air while Ambulating = 96% no dyspnea  Please briefly explain why patient needs home oxygen:   Pt does NOT qualify for supplemental oxygen during activity.    Positioned in recliner to comfort when spouse called room.  Pt trying to answer using call light.  Redirected.  I also updated spouse on pt's mobility performance today.  Slight improvement.  Ideal for him to D/C to SNF at [facility] before [employer].  Also told spouse he did better with Bariatric Walker ([employer] Tall) vs his Rollator.     Follow Up Recommendations  SNF     Equipment Recommendations  None recommended by PT    Recommendations for Other Services       Precautions / Restrictions Precautions Precautions: Fall Precaution  Comments: L rib/flank pain from fall plus Neuropathy Restrictions Weight Bearing Restrictions: No    Mobility  Bed Mobility Overal bed mobility: Needs Assistance Bed Mobility: Supine to Sit Rolling: Mod assist;Min assist         General bed mobility comments: improved ability to self rise with HOB elevated and use of rails  Transfers Overall transfer level: Needs assistance Equipment used: Rolling walker (2 wheeled)(Bariatric) Transfers: Sit to/from [employer] Transfers Sit to Stand: Mod assist;Min assist Stand pivot transfers: Mod assist       General transfer comment: 50% VC's on proper hand placement to push up from recliner vs pull up on walker as well as 75% VC's on turn completion and target prior to sit.  Ambulation/Gait Ambulation/Gait assistance: Min assist;Mod assist Gait Distance (Feet): 22 Feet(11 feet x 2 one seated rest break) Assistive device: Rolling walker (2 wheeled)(Bariatric) Gait Pattern/deviations: Step-to pattern;Staggering left;Leaning posteriorly;Decreased stance time - left Gait velocity: decreased   General Gait Details: somewhat improved gaiy today using Bariatric RW vs his Rollator.  50% VC's on upright posture and proper walker to self distance.  Recliner following closely behind due to L knee buckle.   Stairs             Wheelchair Mobility    Modified Rankin (Stroke Patients Only)       Balance  Cognition Arousal/Alertness: Awake/alert Behavior During Therapy: WFL for tasks assessed/performed Overall Cognitive Status: History of cognitive impairments - at baseline Area of Impairment: Memory;Safety/judgement;Problem solving                     Memory: Decreased short-term memory Following Commands: Follows one step commands with increased time Safety/Judgement: Decreased awareness of safety   Problem Solving: Difficulty sequencing;Requires verbal  cues;Requires tactile cues General Comments: spouse ststed pt has mild dementia/forgetfullness      Exercises      General Comments        Pertinent Vitals/Pain Pain Assessment: Faces Faces Pain Scale: Hurts a little bit Pain Location: ribs when coughing, back, legs and feet Pain Descriptors / Indicators: Discomfort;Aching;Sore Pain Intervention(s): Monitored during session;Repositioned    Home Living                      Prior Function            PT Goals (current goals can now be found in the care plan section) Progress towards PT goals: Progressing toward goals    Frequency    Min 3X/week      PT Plan Current plan remains appropriate    Co-evaluation              AM-PAC PT "6 Clicks" Mobility   Outcome Measure  Help needed turning from your back to your side while in a flat bed without using bedrails?: A Lot Help needed moving from lying on your back to sitting on the side of a flat bed without using bedrails?: A Lot Help needed moving to and from a bed to a chair (including a wheelchair)?: A Lot Help needed standing up from a chair using your arms (e.g., wheelchair or bedside chair)?: A Lot Help needed to walk in hospital room?: A Lot Help needed climbing 3-5 steps with a railing? : Total 6 Click Score: 11    End of Session Equipment Utilized During Treatment: Gait belt Activity Tolerance: Patient tolerated treatment well Patient left: in chair;with call bell/phone within reach;with chair alarm set Nurse Communication: Mobility status PT Visit Diagnosis: Muscle weakness (generalized) (M62.81);Difficulty in walking, not elsewhere classified (R26.2);Pain;History of falling (Z91.81);Repeated falls (R29.6)     Time: [id number] PT Time Calculation (min) (ACUTE ONLY): 56 min  Charges:  $Gait Training: 23-37 mins $Therapeutic Exercise: 23-37 mins                     Rica Koyanagi  PTA Acute  Rehabilitation Services Pager       [phone] Office      [phone]

## 2019-09-02 NOTE — Progress Notes (Signed)
PROGRESS NOTE    Dustin Spratling Sr.   BVQ:[id number]  DOB: [birth year]  DOA: 08/27/2019 PCP: Javier Glazier, MD   Brief Narrative:  Dustin Popper Sr. 86 y.o.malefrom independent living with medical history significant ofperipheral neuropathy,  prostate cancer, GERD, prior duodenal ulcer, osteoporosis and ventral hernia who has had previous falls last year with left-sided rib fractures presented with dizziness and fall, found to have multiple PEs with severe burden.  Started on anticoagulation and subsequently switched to Eliquis.  Initial COVID-19 screen test was negative but due to persistent fever spikes and chest x-ray findings concerning for multilobar pneumonia, repeat Covid test was done on 12/20 which came back positive. He is on remdesivir, dexamethasone and was also started on ceftriaxone, azithromycin for possible underlying bacterial pneumonia.   PT/OT recommended SNF for short-term rehab.   Subjective: Appears a little confused today and tells me he is from [place] and is visiting someone in [place] but does not know who he is visiting.  Assessment & Plan:   Principal Problem:   Acute respiratory failure related to submassive pulmonary emboli and Covid pneumonia -The patient was placed on heparin and has been transitioned to Eliquis as of 1/20 -Lower extremity venous duplex consistent with left lower extremity DVT in the femoral and popliteal veins -His pneumonia is being treated with remdesivir, steroids, ceftriaxone and azithromycin -Receiving supportive treatment with vitamin C zinc and inhalers -He was able to be weaned down from oxygen at rest -Inflammatory markers are noted to be improving  Active Problems:   Acute metabolic encephalopathy - ?  If he has underlying dementia already of steroids are causing his mild confusion -Continue to follow- tele sitter has been ordered  Hypertension -Continue metoprolol  Peripheral neuropathy/fall -Continue  Neurontin-PT recommends that he be transitioned to skilled nursing when discharged  Vitamin D Deficiency - cont supplements  Gastroesophageal reflux disease -Continue PPI  Time spent in minutes: 35 minutes DVT prophylaxis: Eliquis Code Status: Full code Family Communication:  Disposition Plan: SNF Consultants:   None Procedures:   None Antimicrobials:  Anti-infectives (From admission, onward)   Start     Dose/Rate Route Frequency Ordered Stop   09/01/19 1000  remdesivir 100 mg in sodium chloride 0.9 % 100 mL IVPB     100 mg 200 mL/hr over 30 Minutes Intravenous Daily 08/31/19 0838 09/05/19 0959   08/31/19 1000  remdesivir 200 mg in sodium chloride 0.9% 250 mL IVPB     200 mg 580 mL/hr over 30 Minutes Intravenous Once 08/31/19 0838 08/31/19 1137   08/30/19 1530  azithromycin (ZITHROMAX) 500 mg in sodium chloride 0.9 % 250 mL IVPB     500 mg 250 mL/hr over 60 Minutes Intravenous Every 24 hours 08/30/19 1455 09/03/19 2359   08/30/19 1500  cefTRIAXone (ROCEPHIN) 1 g in sodium chloride 0.9 % 100 mL IVPB     1 g 200 mL/hr over 30 Minutes Intravenous Every 24 hours 08/30/19 1455 09/03/19 2359       Objective: Vitals:   08/31/19 2139 09/01/19 0510 09/01/19 2134 09/02/19 0537  BP: 133/89 127/80 122/81 (!) 149/98  Pulse: 76 66 73 67  Resp: '20 18 20 20  '$ Temp: 98.5 F (36.9 C) 98 F (36.7 C) 98.6 F (37 C) 98.1 F (36.7 C)  TempSrc: Oral Oral Oral Oral  SpO2: 96% 94% 95% 92%  Weight:      Height:        Intake/Output Summary (Last 24 hours) at 09/02/2019 1104 Last data  filed at 09/02/2019 0600 Gross per 24 hour  Intake 770 ml  Output 1400 ml  Net -630 ml   Filed Weights   08/27/19 0450 08/27/19 2100 08/30/19 1457  Weight: 114.3 kg 113 kg 113.3 kg    Examination: General exam: Appears comfortable  HEENT: PERRLA, oral mucosa moist, no sclera icterus or thrush Respiratory system: Clear to auscultation. Respiratory effort normal. Cardiovascular system: S1 & S2  heard, RRR.   Gastrointestinal system: Abdomen soft, non-tender, nondistended. Normal bowel sounds. Central nervous system: Alert and oriented x 2- he forgot he was in the hospital. No focal neurological deficits. Extremities: No cyanosis, clubbing or edema Skin: No rashes or ulcers Psychiatry:  Mood & affect appropriate.     Data Reviewed: I have personally reviewed following labs and imaging studies  CBC: Recent Labs  Lab 08/27/19 0522 08/27/19 0522 08/28/19 0018 08/29/19 0406 08/30/19 0458 08/31/19 0315 09/02/19 0326  WBC 6.5   < > 7.3 5.0 5.2 7.4 6.2  NEUTROABS 4.8  --   --   --   --   --   --   HGB 14.4   < > 14.5 14.1 13.9 13.5 12.9*  HCT 42.8   < > 42.8 41.9 41.6 40.3 39.3  MCV 91.1   < > 90.7 90.3 91.4 89.2 90.1  PLT 159   < > 159 148* 142* 144* 168   < > = values in this interval not displayed.   Basic Metabolic Panel: Recent Labs  Lab 08/27/19 0522 08/28/19 0018 08/31/19 0315 09/01/19 0313 09/02/19 0326  NA 140 141 134* 139 139  K 3.9 3.6 4.1 4.4 4.1  CL 106 106 101 106 103  CO2 '26 28 23 23 28  '$ GLUCOSE 126* 110* 109* 111* 106*  BUN '16 13 18 '$ 25* 31*  CREATININE 0.97 0.89 0.91 0.80 0.83  CALCIUM 8.7* 8.8* 8.4* 8.8* 8.8*   GFR: Estimated Creatinine Clearance: 81.5 mL/min (by C-G formula based on SCr of 0.83 mg/dL). Liver Function Tests: Recent Labs  Lab 08/28/19 0018 09/01/19 0313 09/02/19 0326  AST 22 38 31  ALT '15 31 27  '$ ALKPHOS 70 57 54  BILITOT 0.8 0.5 0.5  PROT 6.2* 6.2* 5.8*  ALBUMIN 3.5 3.3* 3.1*   No results for input(s): LIPASE, AMYLASE in the last 168 hours. No results for input(s): AMMONIA in the last 168 hours. Coagulation Profile: No results for input(s): INR, PROTIME in the last 168 hours. Cardiac Enzymes: No results for input(s): CKTOTAL, CKMB, CKMBINDEX, TROPONINI in the last 168 hours. BNP (last 3 results) No results for input(s): PROBNP in the last 8760 hours. HbA1C: No results for input(s): HGBA1C in the last 72  hours. CBG: No results for input(s): GLUCAP in the last 168 hours. Lipid Profile: No results for input(s): CHOL, HDL, LDLCALC, TRIG, CHOLHDL, LDLDIRECT in the last 72 hours. Thyroid Function Tests: No results for input(s): TSH, T4TOTAL, FREET4, T3FREE, THYROIDAB in the last 72 hours. Anemia Panel: Recent Labs    09/01/19 0313 09/02/19 0326  FERRITIN 523* 470*   Urine analysis:    Component Value Date/Time   COLORURINE YELLOW 08/30/2019 0706   APPEARANCEUR CLEAR 08/30/2019 0706   LABSPEC 1.018 08/30/2019 0706   PHURINE 5.0 08/30/2019 0706   GLUCOSEU NEGATIVE 08/30/2019 0706   HGBUR LARGE (A) 08/30/2019 0706   BILIRUBINUR NEGATIVE 08/30/2019 0706   KETONESUR 5 (A) 08/30/2019 0706   PROTEINUR NEGATIVE 08/30/2019 0706   NITRITE NEGATIVE 08/30/2019 0706   LEUKOCYTESUR NEGATIVE 08/30/2019 0706  Sepsis Labs: '@LABRCNTIP'$ (procalcitonin:4,lacticidven:4) ) Recent Results (from the past 240 hour(s))  SARS Coronavirus 2 Ag (30 min TAT) - Nasal Swab (BD Veritor Kit)     Status: None   Collection Time: 08/27/19  5:22 AM   Specimen: Nasal Swab (BD Veritor Kit)  Result Value Ref Range Status   SARS Coronavirus 2 Ag NEGATIVE NEGATIVE Final    Comment: (NOTE) SARS-CoV-2 antigen NOT DETECTED.  Negative results are presumptive.  Negative results do not preclude SARS-CoV-2 infection and should not be used as the sole basis for treatment or other patient management decisions, including infection  control decisions, particularly in the presence of clinical signs and  symptoms consistent with COVID-19, or in those who have been in contact with the virus.  Negative results must be combined with clinical observations, patient history, and epidemiological information. The expected result is Negative. Fact Sheet for Patients: PodPark.tn Fact Sheet for Healthcare Providers: GiftContent.is This test is not yet approved or cleared by the  [place] FDA and  has been authorized for detection and/or diagnosis of SARS-CoV-2 by FDA under an Emergency Use Authorization (EUA).  This EUA will remain in effect (meaning this test can be used) for the duration of  the COVID-19 de claration under Section 564(b)(1) of the Act, 21 U.S.C. section 360bbb-3(b)(1), unless the authorization is terminated or revoked sooner. Performed at [facility], [address] [place], [place] [address]   SARS CORONAVIRUS 2 (TAT 6-24 HRS) Nasopharyngeal Nasopharyngeal Swab     Status: None   Collection Time: 08/27/19  8:00 AM   Specimen: Nasopharyngeal Swab  Result Value Ref Range Status   SARS Coronavirus 2 NEGATIVE NEGATIVE Final    Comment: (NOTE) SARS-CoV-2 target nucleic acids are NOT DETECTED. The SARS-CoV-2 RNA is generally detectable in upper and lower respiratory specimens during the acute phase of infection. Negative results do not preclude SARS-CoV-2 infection, do not rule out co-infections with other pathogens, and should not be used as the sole basis for treatment or other patient management decisions. Negative results must be combined with clinical observations, patient history, and epidemiological information. The expected result is Negative. Fact Sheet for Patients: SugarRoll.be Fact Sheet for Healthcare Providers: https://www.woods-mathews.com/ This test is not yet approved or cleared by the [place] FDA and  has been authorized for detection and/or diagnosis of SARS-CoV-2 by FDA under an Emergency Use Authorization (EUA). This EUA will remain  in effect (meaning this test can be used) for the duration of the COVID-19 declaration under Section 56 4(b)(1) of the Act, 21 U.S.C. section 360bbb-3(b)(1), unless the authorization is terminated or revoked sooner. Performed at [facility], [place] [address], [place], [place] [address]   Urine Culture     Status:  Abnormal   Collection Time: 08/30/19  7:06 AM   Specimen: Urine, Random  Result Value Ref Range Status   Specimen Description   Final    URINE, RANDOM Performed at [place] [address] [place], [place] [address]    Special Requests   Final    NONE Performed at [facility], [place] [address] [place], [place] [address]    Culture 50,000 COLONIES/mL CITROBACTER KOSERI (A)  Final   Report Status 09/01/2019 FINAL  Final   Organism ID, Bacteria CITROBACTER KOSERI (A)  Final      Susceptibility   Citrobacter koseri - MIC*    CEFAZOLIN <=4 SENSITIVE Sensitive     CEFTRIAXONE <=0.25 SENSITIVE Sensitive  CIPROFLOXACIN <=0.25 SENSITIVE Sensitive     GENTAMICIN <=1 SENSITIVE Sensitive     IMIPENEM <=0.25 SENSITIVE Sensitive     NITROFURANTOIN <=16 SENSITIVE Sensitive     TRIMETH/SULFA <=20 SENSITIVE Sensitive     PIP/TAZO <=4 SENSITIVE Sensitive     * 50,000 COLONIES/mL CITROBACTER KOSERI  Culture, blood (routine x 2)     Status: None (Preliminary result)   Collection Time: 08/30/19  7:26 AM   Specimen: BLOOD RIGHT HAND  Result Value Ref Range Status   Specimen Description   Final    BLOOD RIGHT HAND Performed at [place] [address] [place], [place] [address]    Special Requests   Final    BOTTLES DRAWN AEROBIC ONLY Blood Culture results may not be optimal due to an inadequate volume of blood received in culture bottles Performed at [place] [address] [place], [place] [address]    Culture   Final    NO GROWTH 3 DAYS Performed at [facility], [place] [address], [place], [place] [address]    Report Status PENDING  Incomplete  Culture, blood (routine x 2)     Status: None (Preliminary result)   Collection Time: 08/30/19  7:26 AM   Specimen: BLOOD LEFT HAND  Result Value Ref Range Status   Specimen Description   Final    BLOOD LEFT HAND Performed at [place] [address], [place], [place] [address]    Special Requests   Final    BOTTLES DRAWN AEROBIC ONLY Blood Culture adequate volume Performed at [place] [address], [place], [place] [address]    Culture   Final    NO GROWTH 3 DAYS Performed at [facility], [place] [address] [place],  [address]    Report Status PENDING  Incomplete  MRSA PCR Screening     Status: None   Collection Time: 08/30/19  8:02 AM   Specimen: Nasopharyngeal Swab  Result Value Ref Range Status   MRSA by PCR NEGATIVE NEGATIVE Final    Comment:        The GeneXpert MRSA Assay (FDA approved for NASAL specimens only), is one component of a comprehensive MRSA colonization surveillance program. It is not intended to diagnose MRSA infection nor to guide or monitor treatment for MRSA infections. Performed at [facility], [place] [address] [place], [place] [address]   SARS CORONAVIRUS 2 (TAT 6-24 HRS) Nasopharyngeal Nasopharyngeal Swab     Status: Abnormal   Collection Time: 08/30/19  2:44 PM   Specimen: Nasopharyngeal Swab  Result Value Ref Range Status   SARS Coronavirus 2 POSITIVE (A) NEGATIVE Final    Comment: RESULT CALLED TO, READ BACK BY AND VERIFIED WITH: RN Danley Danker AT 0025 BY MESSAN H. ON 08/31/2019 (NOTE) SARS-CoV-2 target nucleic acids are DETECTED. The SARS-CoV-2 RNA is generally detectable in upper and lower respiratory specimens during the acute phase of infection. Positive results are indicative of the presence of SARS-CoV-2 RNA. Clinical correlation with patient history and other diagnostic information is  necessary to determine patient infection status. Positive results do not rule out bacterial infection or co-infection with other viruses.  The expected result is Negative. Fact Sheet for Patients: SugarRoll.be Fact Sheet for Healthcare  Providers: https://www.woods-mathews.com/ This test is not yet approved or cleared by the [place] FDA and  has been authorized for detection and/or diagnosis of SARS-CoV-2 by FDA under an Emergency  Use Authorization (EUA). This EUA will remain  in effect (meaning this test can be  used) for the duration of the COVID-19 declaration under Section 564(b)(1) of the Act, 21 U.S.C. section 360bbb-3(b)(1), unless the authorization is terminated or revoked sooner. Performed at [facility], [place] [address], [place], [place] [address]          Radiology Studies: No results found.    Scheduled Meds: . apixaban  10 mg Oral BID   Followed by  . [START ON 09/06/2019] apixaban  5 mg Oral BID  . vitamin C  500 mg Oral Daily  . Chlorhexidine Gluconate Cloth  6 each Topical Daily  . cholecalciferol  5,000 Units Oral Daily  . [START ON 09/15/2019] cyanocobalamin  1,000 mcg Intramuscular Q30 days  . dexamethasone (DECADRON) injection  6 mg Intravenous Q24H  . DULoxetine  60 mg Oral QHS  . gabapentin  300 mg Oral BID  . lidocaine  1 patch Transdermal Q24H  . mouth rinse  15 mL Mouth Rinse BID  . metoprolol tartrate  12.5 mg Oral BID  . pantoprazole  40 mg Oral Daily  . polyethylene glycol  17 g Oral Daily  . senna-docusate  1 tablet Oral BID  . zinc sulfate  220 mg Oral Daily   Continuous Infusions: . azithromycin 500 mg (09/01/19 1505)  . cefTRIAXone (ROCEPHIN)  IV 1 g (09/01/19 1559)  . remdesivir 100 mg in NS 100 mL 100 mg (09/01/19 0926)     LOS: 6 days      Debbe Odea, MD [facility] Pager: [email or web address] Password [facility] 09/02/2019, 11:04 AM

## 2019-09-03 LAB — COMPREHENSIVE METABOLIC PANEL
ALT: 38 U/L (ref 0–44)
AST: 41 U/L (ref 15–41)
Albumin: 3.2 g/dL — ABNORMAL LOW (ref 3.5–5.0)
Alkaline Phosphatase: 55 U/L (ref 38–126)
Anion gap: 6 (ref 5–15)
BUN: 26 mg/dL — ABNORMAL HIGH (ref 8–23)
CO2: 27 mmol/L (ref 22–32)
Calcium: 8.6 mg/dL — ABNORMAL LOW (ref 8.9–10.3)
Chloride: 106 mmol/L (ref 98–111)
Creatinine, Ser: 0.8 mg/dL (ref 0.61–1.24)
GFR calc Af Amer: >60 mL/min (ref >60)
GFR calc non Af Amer: >60 mL/min (ref >60)
Glucose, Bld: 105 mg/dL — ABNORMAL HIGH (ref 70–99)
Potassium: 4.3 mmol/L (ref 3.5–5.1)
Sodium: 139 mmol/L (ref 135–145)
Total Bilirubin: 0.7 mg/dL (ref 0.3–1.2)
Total Protein: 5.9 g/dL — ABNORMAL LOW (ref 6.5–8.1)

## 2019-09-03 LAB — D-DIMER, QUANTITATIVE: D-Dimer, Quant: 1.86 ug/mL-FEU — ABNORMAL HIGH (ref 0.00–0.50)

## 2019-09-03 LAB — C-REACTIVE PROTEIN: CRP: 1 mg/dL — ABNORMAL HIGH (ref <1.0)

## 2019-09-03 LAB — FERRITIN: Ferritin: 386 ng/mL — ABNORMAL HIGH (ref 24–336)

## 2019-09-03 NOTE — Progress Notes (Signed)
PROGRESS NOTE    Dustin Cueto Sr.   IHK:[id number]  DOB: 31-Aug-1929  DOA: 08/27/2019 PCP: Javier Glazier, MD   Brief Narrative:  Dustin Popper Sr. 86 y.o.malefrom independent living with medical history significant ofperipheral neuropathy,  prostate cancer, GERD, prior duodenal ulcer, osteoporosis and ventral hernia who has had previous falls last year with left-sided rib fractures presented with dizziness and fall, found to have multiple PEs with severe burden.  Started on anticoagulation and subsequently switched to Eliquis.  Initial COVID-19 screen test was negative but due to persistent fever spikes and chest x-ray findings concerning for multilobar pneumonia, repeat Covid test was done on 12/20 which came back positive. He is on remdesivir, dexamethasone and was also started on ceftriaxone, azithromycin for possible underlying bacterial pneumonia.   PT/OT recommended SNF for short-term rehab.   Subjective: Much more oriented today. He has no complaints.   Assessment & Plan:   Principal Problem:   Acute respiratory failure related to submassive pulmonary emboli and Covid pneumonia -The patient was placed on heparin and has been transitioned to Eliquis as of 1/20 -Lower extremity venous duplex consistent with left lower extremity DVT in the femoral and popliteal veins -His pneumonia is being treated with remdesivir, steroids, ceftriaxone and azithromycin -Receiving supportive treatment with vitamin C zinc and inhalers -He was able to be weaned down from oxygen   -Inflammatory markers are noted to be improving  Active Problems:   Acute metabolic encephalopathy - ?  If he has underlying dementia already of steroids are causing his mild confusion - much better today  Hypertension -Continue metoprolol  Peripheral neuropathy/fall -Continue Neurontin-PT recommends that he be transitioned to skilled nursing when discharged  Vitamin D Deficiency - cont  supplements  Gastroesophageal reflux disease -Continue PPI  Time spent in minutes: 35 minutes DVT prophylaxis: Eliquis Code Status: Full code Family Communication:  Disposition Plan: SNF tomorrow Consultants:   None Procedures:   None Antimicrobials:  Anti-infectives (From admission, onward)   Start     Dose/Rate Route Frequency Ordered Stop   09/01/19 1000  remdesivir 100 mg in sodium chloride 0.9 % 100 mL IVPB     100 mg 200 mL/hr over 30 Minutes Intravenous Daily 08/31/19 0838 09/05/19 0959   08/31/19 1000  remdesivir 200 mg in sodium chloride 0.9% 250 mL IVPB     200 mg 580 mL/hr over 30 Minutes Intravenous Once 08/31/19 0838 08/31/19 1137   08/30/19 1530  azithromycin (ZITHROMAX) 500 mg in sodium chloride 0.9 % 250 mL IVPB     500 mg 250 mL/hr over 60 Minutes Intravenous Every 24 hours 08/30/19 1455 09/03/19 2359   08/30/19 1500  cefTRIAXone (ROCEPHIN) 1 g in sodium chloride 0.9 % 100 mL IVPB     1 g 200 mL/hr over 30 Minutes Intravenous Every 24 hours 08/30/19 1455 09/03/19 2359       Objective: Vitals:   09/02/19 1215 09/02/19 1439 09/02/19 2044 09/03/19 0610  BP: 113/75 111/65 126/77 (!) 144/88  Pulse: 71 64 68 (!) 58  Resp:  '20 20 20  '$ Temp:  98 F (36.7 C) 98.1 F (36.7 C) 98.1 F (36.7 C)  TempSrc:  Oral Oral Oral  SpO2:  93% 95% 92%  Weight:      Height:        Intake/Output Summary (Last 24 hours) at 09/03/2019 1005 Last data filed at 09/03/2019 0957 Gross per 24 hour  Intake 1468 ml  Output 1150 ml  Net 318 ml  Filed Weights   08/27/19 0450 08/27/19 2100 08/30/19 1457  Weight: 114.3 kg 113 kg 113.3 kg    Examination: General exam: Appears comfortable  HEENT: PERRLA, oral mucosa moist, no sclera icterus or thrush Respiratory system: Clear to auscultation. Respiratory effort normal. Cardiovascular system: S1 & S2 heard,  No murmurs  Gastrointestinal system: Abdomen soft, non-tender, nondistended. Normal bowel sounds   Central nervous  system: Alert and oriented. No focal neurological deficits. Extremities: No cyanosis, clubbing or edema Skin: No rashes or ulcers Psychiatry:  Mood & affect appropriate.   Data Reviewed: I have personally reviewed following labs and imaging studies  CBC: Recent Labs  Lab 08/28/19 0018 08/29/19 0406 08/30/19 0458 08/31/19 0315 09/02/19 0326  WBC 7.3 5.0 5.2 7.4 6.2  HGB 14.5 14.1 13.9 13.5 12.9*  HCT 42.8 41.9 41.6 40.3 39.3  MCV 90.7 90.3 91.4 89.2 90.1  PLT 159 148* 142* 144* 532   Basic Metabolic Panel: Recent Labs  Lab 08/28/19 0018 08/31/19 0315 09/01/19 0313 09/02/19 0326 09/03/19 0352  NA 141 134* 139 139 139  K 3.6 4.1 4.4 4.1 4.3  CL 106 101 106 103 106  CO2 '28 23 23 28 27  '$ GLUCOSE 110* 109* 111* 106* 105*  BUN 13 18 25* 31* 26*  CREATININE 0.89 0.91 0.80 0.83 0.80  CALCIUM 8.8* 8.4* 8.8* 8.8* 8.6*   GFR: Estimated Creatinine Clearance: 84.6 mL/min (by C-G formula based on SCr of 0.8 mg/dL). Liver Function Tests: Recent Labs  Lab 08/28/19 0018 09/01/19 0313 09/02/19 0326 09/03/19 0352  AST 22 38 31 41  ALT '15 31 27 '$ 38  ALKPHOS 70 57 54 55  BILITOT 0.8 0.5 0.5 0.7  PROT 6.2* 6.2* 5.8* 5.9*  ALBUMIN 3.5 3.3* 3.1* 3.2*   No results for input(s): LIPASE, AMYLASE in the last 168 hours. No results for input(s): AMMONIA in the last 168 hours. Coagulation Profile: No results for input(s): INR, PROTIME in the last 168 hours. Cardiac Enzymes: No results for input(s): CKTOTAL, CKMB, CKMBINDEX, TROPONINI in the last 168 hours. BNP (last 3 results) No results for input(s): PROBNP in the last 8760 hours. HbA1C: No results for input(s): HGBA1C in the last 72 hours. CBG: No results for input(s): GLUCAP in the last 168 hours. Lipid Profile: No results for input(s): CHOL, HDL, LDLCALC, TRIG, CHOLHDL, LDLDIRECT in the last 72 hours. Thyroid Function Tests: No results for input(s): TSH, T4TOTAL, FREET4, T3FREE, THYROIDAB in the last 72 hours. Anemia  Panel: Recent Labs    09/02/19 0326 09/03/19 0352  FERRITIN 470* 386*   Urine analysis:    Component Value Date/Time   COLORURINE YELLOW 08/30/2019 0706   APPEARANCEUR CLEAR 08/30/2019 0706   LABSPEC 1.018 08/30/2019 0706   PHURINE 5.0 08/30/2019 0706   GLUCOSEU NEGATIVE 08/30/2019 0706   HGBUR LARGE (A) 08/30/2019 0706   BILIRUBINUR NEGATIVE 08/30/2019 0706   KETONESUR 5 (A) 08/30/2019 0706   PROTEINUR NEGATIVE 08/30/2019 0706   NITRITE NEGATIVE 08/30/2019 0706   LEUKOCYTESUR NEGATIVE 08/30/2019 0706   Sepsis Labs: '@LABRCNTIP'$ (procalcitonin:4,lacticidven:4) ) Recent Results (from the past 240 hour(s))  SARS Coronavirus 2 Ag (30 min TAT) - Nasal Swab (BD Veritor Kit)     Status: None   Collection Time: 08/27/19  5:22 AM   Specimen: Nasal Swab (BD Veritor Kit)  Result Value Ref Range Status   SARS Coronavirus 2 Ag NEGATIVE NEGATIVE Final    Comment: (NOTE) SARS-CoV-2 antigen NOT DETECTED.  Negative results are presumptive.  Negative results do not preclude  SARS-CoV-2 infection and should not be used as the sole basis for treatment or other patient management decisions, including infection  control decisions, particularly in the presence of clinical signs and  symptoms consistent with COVID-19, or in those who have been in contact with the virus.  Negative results must be combined with clinical observations, patient history, and epidemiological information. The expected result is Negative. Fact Sheet for Patients: PodPark.tn Fact Sheet for Healthcare Providers: GiftContent.is This test is not yet approved or cleared by the [place] FDA and  has been authorized for detection and/or diagnosis of SARS-CoV-2 by FDA under an Emergency Use Authorization (EUA).  This EUA will remain in effect (meaning this test can be used) for the duration of  the COVID-19 de claration under Section 564(b)(1) of the Act, 21 U.S.C.  section 360bbb-3(b)(1), unless the authorization is terminated or revoked sooner. Performed at [facility], [address] [place], [place] [address]   SARS CORONAVIRUS 2 (TAT 6-24 HRS) Nasopharyngeal Nasopharyngeal Swab     Status: None   Collection Time: 08/27/19  8:00 AM   Specimen: Nasopharyngeal Swab  Result Value Ref Range Status   SARS Coronavirus 2 NEGATIVE NEGATIVE Final    Comment: (NOTE) SARS-CoV-2 target nucleic acids are NOT DETECTED. The SARS-CoV-2 RNA is generally detectable in upper and lower respiratory specimens during the acute phase of infection. Negative results do not preclude SARS-CoV-2 infection, do not rule out co-infections with other pathogens, and should not be used as the sole basis for treatment or other patient management decisions. Negative results must be combined with clinical observations, patient history, and epidemiological information. The expected result is Negative. Fact Sheet for Patients: SugarRoll.be Fact Sheet for Healthcare Providers: https://www.woods-mathews.com/ This test is not yet approved or cleared by the [place] FDA and  has been authorized for detection and/or diagnosis of SARS-CoV-2 by FDA under an Emergency Use Authorization (EUA). This EUA will remain  in effect (meaning this test can be used) for the duration of the COVID-19 declaration under Section 56 4(b)(1) of the Act, 21 U.S.C. section 360bbb-3(b)(1), unless the authorization is terminated or revoked sooner. Performed at [facility], [place] [address] [place], [place] [address]   Urine Culture     Status: Abnormal   Collection Time: 08/30/19  7:06 AM   Specimen: Urine, Random  Result Value Ref Range Status   Specimen Description   Final    URINE, RANDOM Performed at [place] [address] [place], [place] [address]    Special Requests   Final    NONE Performed at [facility], [place] [address] [place], [place] [address]    Culture 50,000 COLONIES/mL CITROBACTER KOSERI (A)  Final   Report Status 09/01/2019 FINAL  Final   Organism ID, Bacteria CITROBACTER KOSERI (A)  Final      Susceptibility   Citrobacter koseri - MIC*    CEFAZOLIN <=4 SENSITIVE Sensitive     CEFTRIAXONE <=0.25 SENSITIVE Sensitive     CIPROFLOXACIN <=0.25 SENSITIVE Sensitive     GENTAMICIN <=1 SENSITIVE Sensitive     IMIPENEM <=0.25 SENSITIVE Sensitive     NITROFURANTOIN <=16 SENSITIVE Sensitive     TRIMETH/SULFA <=20 SENSITIVE Sensitive     PIP/TAZO <=4 SENSITIVE Sensitive     * 50,000 COLONIES/mL CITROBACTER KOSERI  Culture, blood (routine x 2)     Status: None (Preliminary result)   Collection Time: 08/30/19  7:26 AM   Specimen:  BLOOD RIGHT HAND  Result Value Ref Range Status   Specimen Description   Final    BLOOD RIGHT HAND Performed at [place] [address], [place], [place] [address]    Special Requests   Final    BOTTLES DRAWN AEROBIC ONLY Blood Culture results may not be optimal due to an inadequate volume of blood received in culture bottles Performed at [facility] [place] [address] [place], [place] [address]    Culture   Final    NO GROWTH 3 DAYS Performed at [facility], [place] [address], [place], [place] [address]    Report Status PENDING  Incomplete  Culture, blood (routine x 2)     Status: None (Preliminary result)   Collection Time: 08/30/19  7:26 AM   Specimen: BLOOD LEFT HAND  Result Value Ref Range Status   Specimen Description   Final    BLOOD LEFT HAND Performed at [facility] [address], [place], [place] [address]    Special Requests   Final    BOTTLES DRAWN AEROBIC ONLY Blood Culture adequate volume Performed at [place] [address], [place], [place] [address]    Culture   Final    NO GROWTH 3 DAYS Performed at [facility], [place] [address] [place], [place] [address]    Report Status PENDING  Incomplete  MRSA PCR Screening     Status: None   Collection Time: 08/30/19  8:02 AM   Specimen: Nasopharyngeal Swab  Result Value Ref Range Status   MRSA by PCR NEGATIVE NEGATIVE Final    Comment:        The GeneXpert MRSA Assay (FDA approved for NASAL specimens only), is one component of a comprehensive MRSA colonization surveillance program. It is not intended to diagnose MRSA infection nor to guide or monitor treatment for MRSA infections. Performed at [facility], [place] [address], [place], [place] [address]   SARS CORONAVIRUS 2 (TAT 6-24 HRS) Nasopharyngeal Nasopharyngeal Swab     Status: Abnormal   Collection Time: 08/30/19  2:44 PM   Specimen: Nasopharyngeal Swab  Result Value Ref Range Status   SARS Coronavirus 2 POSITIVE (A) NEGATIVE Final    Comment: RESULT CALLED TO, READ BACK BY AND VERIFIED WITH: RN Danley Danker AT 0025 BY MESSAN H. ON 08/31/2019 (NOTE) SARS-CoV-2 target nucleic acids are DETECTED. The SARS-CoV-2 RNA is generally detectable in upper and lower respiratory specimens during the acute phase of infection. Positive results are indicative of the presence of SARS-CoV-2 RNA. Clinical correlation with patient history and other diagnostic information is  necessary to determine patient infection status. Positive results do not rule out bacterial infection or co-infection with other viruses.  The expected result is Negative. Fact Sheet for Patients: SugarRoll.be Fact Sheet for Healthcare Providers: https://www.woods-mathews.com/ This test is not yet approved or cleared by the [place] FDA and  has been authorized for detection and/or diagnosis of SARS-CoV-2 by FDA under an Emergency Use Authorization (EUA). This EUA will remain  in effect (meaning this test can be  used) for the duration of the COVID-19 declaration  under Section 564(b)(1) of the Act, 21 U.S.C. section 360bbb-3(b)(1), unless the authorization is terminated or revoked sooner. Performed at [facility], [place] [address], [place],  [address]          Radiology Studies: No results found.    Scheduled Meds: . apixaban  10  mg Oral BID   Followed by  . [START ON 09/06/2019] apixaban  5 mg Oral BID  . vitamin C  500 mg Oral Daily  . Chlorhexidine Gluconate Cloth  6 each Topical Daily  . cholecalciferol  5,000 Units Oral Daily  . [START ON 09/15/2019] cyanocobalamin  1,000 mcg Intramuscular Q30 days  . dexamethasone (DECADRON) injection  6 mg Intravenous Q24H  . DULoxetine  60 mg Oral QHS  . gabapentin  300 mg Oral BID  . lidocaine  1 patch Transdermal Q24H  . mouth rinse  15 mL Mouth Rinse BID  . metoprolol tartrate  12.5 mg Oral BID  . pantoprazole  40 mg Oral Daily  . polyethylene glycol  17 g Oral Daily  . senna-docusate  1 tablet Oral BID  . zinc sulfate  220 mg Oral Daily   Continuous Infusions: . azithromycin 500 mg (09/02/19 1902)  . cefTRIAXone (ROCEPHIN)  IV 1 g (09/02/19 1732)  . remdesivir 100 mg in NS 100 mL 100 mg (09/02/19 1241)     LOS: 7 days      Debbe Odea, MD [facility] Pager: [email or web address] Password [facility] 09/03/2019, 10:05 AM

## 2019-09-04 DIAGNOSIS — J1282 Pneumonia due to coronavirus disease 2019: Secondary | ICD-10-CM

## 2019-09-04 DIAGNOSIS — U071 COVID-19: Principal | ICD-10-CM

## 2019-09-04 LAB — CBC
HCT: 39.2 % (ref 39.0–52.0)
Hemoglobin: 13.1 g/dL (ref 13.0–17.0)
MCH: 30.1 pg (ref 26.0–34.0)
MCHC: 33.4 g/dL (ref 30.0–36.0)
MCV: 90.1 fL (ref 80.0–100.0)
Platelets: 210 10*3/uL (ref 150–400)
RBC: 4.35 MIL/uL (ref 4.22–5.81)
RDW: 13.4 % (ref 11.5–15.5)
WBC: 6.7 10*3/uL (ref 4.0–10.5)
nRBC: 0 % (ref 0.0–0.2)

## 2019-09-04 LAB — CULTURE, BLOOD (ROUTINE X 2)
Culture: NO GROWTH
Culture: NO GROWTH
Special Requests: ADEQUATE

## 2019-09-04 LAB — BASIC METABOLIC PANEL
Anion gap: 8 (ref 5–15)
BUN: 27 mg/dL — ABNORMAL HIGH (ref 8–23)
CO2: 26 mmol/L (ref 22–32)
Calcium: 8.5 mg/dL — ABNORMAL LOW (ref 8.9–10.3)
Chloride: 106 mmol/L (ref 98–111)
Creatinine, Ser: 0.88 mg/dL (ref 0.61–1.24)
GFR calc Af Amer: >60 mL/min (ref >60)
GFR calc non Af Amer: >60 mL/min (ref >60)
Glucose, Bld: 95 mg/dL (ref 70–99)
Potassium: 4 mmol/L (ref 3.5–5.1)
Sodium: 140 mmol/L (ref 135–145)

## 2019-09-04 LAB — D-DIMER, QUANTITATIVE: D-Dimer, Quant: 1.82 ug/mL-FEU — ABNORMAL HIGH (ref 0.00–0.50)

## 2019-09-04 LAB — FERRITIN: Ferritin: 349 ng/mL — ABNORMAL HIGH (ref 24–336)

## 2019-09-04 LAB — C-REACTIVE PROTEIN: CRP: 0.7 mg/dL (ref <1.0)

## 2019-09-04 MED ORDER — APIXABAN 5 MG PO TABS: 5.0000 mg | ORAL_TABLET | Freq: Two times a day (BID) | ORAL | 0 refills | Status: AC

## 2019-09-04 MED ORDER — AZITHROMYCIN 500 MG PO TABS: 500.0000 mg | ORAL_TABLET | Freq: Every day | ORAL | 0 refills | Status: AC

## 2019-09-04 MED ORDER — METOPROLOL SUCCINATE ER 25 MG PO TB24: 12.5000 mg | ORAL_TABLET | Freq: Every day | ORAL | 11 refills | Status: AC

## 2019-09-04 MED ORDER — APIXABAN 5 MG PO TABS: 10.0000 mg | ORAL_TABLET | Freq: Two times a day (BID) | ORAL | 0 refills | Status: AC

## 2019-09-04 MED ORDER — CEFUROXIME AXETIL 500 MG PO TABS: 500.0000 mg | ORAL_TABLET | Freq: Two times a day (BID) | ORAL | 0 refills | Status: AC

## 2019-09-04 MED ORDER — ACETAMINOPHEN 325 MG PO TABS: 650.0000 mg | ORAL_TABLET | Freq: Four times a day (QID) | ORAL | Status: AC | PRN

## 2019-09-04 NOTE — Progress Notes (Signed)
Physical Therapy Treatment Patient Details Name: Dustin Greenlaw Sr. MRN: [id number] DOB: 07/07/1930 Today's Date: 09/04/2019    History of Present Illness 86 year old man admitted with dizziness and fall. Found to have large PE and heart strain L LE DVT.  PMH:  h/o rib fxs, peripheral neuropathy, DM prostate CA.    PT Comments    Pt looks and acting much better.  Also improved cognition today vs Saturday.  Assisted OOB to [facility] then to amb around the room.  General bed mobility comments: improved ability to self rise with HOB elevated and use of rails and bed pad to complete scooting.  General transfer comment: 25% VC's on proper hand placement to push up from recliner vs pull up on walker as well as 50% VC's on turn completion and target prior to sit.  Improving.  General Gait Details: improved gait with Bariatric RW vs pt's small rollator.  Tolerated an increased distance and slight improved balance.  RA avg 91% and HR 108 with amb.  Still [place] FALL RISK due to weakness and his Neuropathy.  Pt plans to D/C to [facility] before back to [employer] with spouse. Had pt call wife during session for a mobility update.    Follow Up Recommendations  SNF     Equipment Recommendations  None recommended by PT    Recommendations for Other Services       Precautions / Restrictions Precautions Precautions: Fall Precaution Comments: L rib/flank pain from fall plus Neuropathy    Mobility  Bed Mobility Overal bed mobility: Needs Assistance Bed Mobility: Supine to Sit Rolling: Min assist;Mod assist         General bed mobility comments: improved ability to self rise with HOB elevated and use of rails and bed pad to complete scooting  Transfers Overall transfer level: Needs assistance Equipment used: Rolling walker (2 wheeled) Transfers: Sit to/from [facility] Sit to Stand: Mod assist;Min assist Stand pivot transfers: Min assist;Mod assist       General  transfer comment: 25% VC's on proper hand placement to push up from recliner vs pull up on walker as well as 50% VC's on turn completion and target prior to sit.  Improving  Ambulation/Gait Ambulation/Gait assistance: Min guard;Supervision Gait Distance (Feet): 44 Feet Assistive device: Rolling walker (2 wheeled) Gait Pattern/deviations: Step-to pattern;Step-through pattern Gait velocity: decreased   General Gait Details: improved gait with Bariatric RW vs pt's small rollator.  Tolerated an increased distance and slight improved balance.  Still [place] FALL RISK due to weakness and his Neuropathy.   Stairs             Wheelchair Mobility    Modified Rankin (Stroke Patients Only)       Balance                                            Cognition Arousal/Alertness: Awake/alert Behavior During Therapy: WFL for tasks assessed/performed Overall Cognitive Status: History of cognitive impairments - at baseline                                 General Comments: improved cognition today vs Saturday.  Following all commands.      Exercises      General Comments        Pertinent  Vitals/Pain Pain Assessment: No/denies pain    Home Living                      Prior Function            PT Goals (current goals can now be found in the care plan section) Progress towards PT goals: Progressing toward goals    Frequency    Min 3X/week      PT Plan Current plan remains appropriate    Co-evaluation              AM-PAC PT "6 Clicks" Mobility   Outcome Measure  Help needed turning from your back to your side while in a flat bed without using bedrails?: A Lot Help needed moving from lying on your back to sitting on the side of a flat bed without using bedrails?: A Lot Help needed moving to and from a bed to a chair (including a wheelchair)?: A Lot Help needed standing up from a chair using your arms (e.g., wheelchair or  bedside chair)?: A Lot Help needed to walk in hospital room?: A Lot Help needed climbing 3-5 steps with a railing? : Total 6 Click Score: 11    End of Session Equipment Utilized During Treatment: Gait belt Activity Tolerance: Patient tolerated treatment well Patient left: in chair;with call bell/phone within reach;with chair alarm set Nurse Communication: Mobility status PT Visit Diagnosis: Muscle weakness (generalized) (M62.81);Difficulty in walking, not elsewhere classified (R26.2);Pain;History of falling (Z91.81);Repeated falls (R29.6)     Time: [id number] PT Time Calculation (min) (ACUTE ONLY): 50 min  Charges:  $Gait Training: 23-37 mins $Therapeutic Activity: 8-22 mins                     {Niamya Vittitow  PTA Acute  Rehabilitation Services Pager      [phone] Office      [phone]

## 2019-09-04 NOTE — Progress Notes (Signed)
Report called to [address] at [facility]. Questions, concerns denied. Patients spouse Majorie updated regarding discharge plan.  Spouse is to collect walker, Ptar is unable to transport with pt.Pt is stable. PTAR at bedside. Pt transported with all belongings except walker.

## 2019-09-04 NOTE — Discharge Summary (Signed)
Physician Discharge Summary  Dustin Abruzzo Sr. HWE:[id number] DOB: 11-08-1929 DOA: 08/27/2019  PCP: Javier Glazier, MD  Admit date: 08/27/2019 Discharge date: 09/04/2019  Admitted From: Independent living Disposition:  SNF   Recommendations for Outpatient Follow-up:  1. F/u on oral intake 2. Continue to isolate for COVID infection 3. Continue Ceftin and Azithromycin for another 5 days 4. High risk for secondary infections  Discharge Condition:  stable   CODE STATUS:  Full code   Diet recommendation:  Heart healthy Consultations:  none    Discharge Diagnoses:  Principal Problem:   Acute respiratory failure -  Pneumonia due to COVID-19 virus Active Problems:   Acute metabolic encephalopathy   Pulmonary embolism (HCC)   GERD (gastroesophageal reflux disease)   Fall at home, initial encounter   Benign essential HTN    Brief Summary: Dustin Popper Sr. 86 y.o.malefrom independent living with medical history significant ofperipheral neuropathy,  prostate cancer, GERD, prior duodenal ulcer, osteoporosis and ventral hernia who has had previous falls last year with left-sided rib fracturespresented with dizziness and fall, found to have multiple PEs with severe burden. Started on anticoagulation and subsequently switched to Eliquis. Initial COVID-19 screen test was negative but due to persistent fever spikes and chest x-ray findings concerning for multilobar pneumonia, repeat Covid test was doneon 12/20 which came back positive. He is on remdesivir, dexamethasone and was also started on ceftriaxone, azithromycin for possible underlying bacterial pneumonia.  PT/OT recommended SNF for short-term rehab.  Hospital Course:  Principal Problem:   Acute respiratory failure related to submassive pulmonary emboli and Covid pneumonia -The patient was placed on heparin and has been transitioned to Eliquis as of 1/20 -Lower extremity venous duplex consistent with left lower extremity  DVT in the femoral and popliteal veins - his COVID test became positive on 1/18 - CXR > Bilateral interstitial and patchy alveolar airspace opacities concerning for multilobar pneumonia including atypical viral pneumonia. -His pneumonia is being treated with remdesivir, steroids, ceftriaxone and azithromycin- he will continue Ceftin and Azithromycin for another 5 days -Receiving supportive treatment with vitamin C zinc and inhalers -He was able to be weaned down from oxygen   -Inflammatory markers are noted to be improving  Active Problems:   Acute metabolic encephalopathy with underlying dementia - ?  If he has underlying dementia already of steroids are causing his mild confusion - much better today  Hypertension -Continue metoprolol  Peripheral neuropathy/fall -Continue Neurontin-PT recommends that he be transitioned to skilled nursing when discharged  Vitamin D Deficiency - cont supplements  Gastroesophageal reflux disease -Continue PPI   Discharge Exam: Vitals:   09/04/19 0419 09/04/19 0844  BP: (!) 145/88 (!) 142/89  Pulse: (!) 59 64  Resp: 18 18  Temp: 98.6 F (37 C)   SpO2: 92% 95%   Vitals:   09/03/19 0610 09/03/19 2051 09/04/19 0419 09/04/19 0844  BP: (!) 144/88 130/86 (!) 145/88 (!) 142/89  Pulse: (!) 58 66 (!) 59 64  Resp: '20 20 18 18  '$ Temp: 98.1 F (36.7 C) 98.6 F (37 C) 98.6 F (37 C)   TempSrc: Oral Oral Oral   SpO2: 92% 93% 92% 95%  Weight:      Height:        General: Pt is alert, awake, not in acute distress Cardiovascular: RRR, S1/S2 +, no rubs, no gallops Respiratory: mild crackles bilaterally with cough, no wheezing, no rhonchi Abdominal: Soft, NT, ND, bowel sounds + Extremities: no edema, no cyanosis   Discharge Instructions  Discharge Instructions    Diet - low sodium heart healthy   Complete by: As directed    Increase activity slowly   Complete by: As directed      Allergies as of 09/04/2019      Reactions    Hydrocodone Rash   Mild rash one time, but has taken other agents with no reaction      Medication List    STOP taking these medications   furosemide 20 MG tablet Commonly known as: LASIX     TAKE these medications   acetaminophen 325 MG tablet Commonly known as: TYLENOL Take 2 tablets (650 mg total) by mouth every 6 (six) hours as needed for mild pain (or Fever >/= 101). What changed:   medication strength  how much to take  when to take this  reasons to take this   apixaban 5 MG Tabs tablet Commonly known as: ELIQUIS Take 2 tablets (10 mg total) by mouth 2 (two) times daily.   apixaban 5 MG Tabs tablet Commonly known as: ELIQUIS Take 1 tablet (5 mg total) by mouth 2 (two) times daily. Start taking on: September 06, 2019   azithromycin 500 MG tablet Commonly known as: Zithromax Take 1 tablet (500 mg total) by mouth daily for 3 days. Take 1 tablet daily for 3 days.   cefUROXime 500 MG tablet Commonly known as: CEFTIN Take 1 tablet (500 mg total) by mouth 2 (two) times daily for 10 days.   cyanocobalamin 1000 MCG/ML injection Commonly known as: (VITAMIN B-12) Inject 1 mL into the muscle every 30 (thirty) days.   DULoxetine 60 MG capsule Commonly known as: CYMBALTA Take 60 mg by mouth at bedtime.   gabapentin 300 MG capsule Commonly known as: NEURONTIN Take 300 mg by mouth 2 (two) times daily.   metoprolol succinate 25 MG 24 hr tablet Commonly known as: Toprol XL Take 0.5 tablets (12.5 mg total) by mouth daily.   OCUVITE PO Take 1 tablet by mouth daily.   REFRESH OP Apply 1 drop to eye daily as needed (dry eyes).   VITAMIN D3 PO Take 5,000 Units by mouth daily.       Allergies  Allergen Reactions  . Hydrocodone Rash    Mild rash one time, but has taken other agents with no reaction     Procedures/Studies:    CT Angio Chest PE W and/or Wo Contrast  Result Date: 08/27/2019 CLINICAL DATA:  Dizziness, fall, rib pain common abdominal trauma  EXAM: CT ANGIOGRAPHY CHEST CT ABDOMEN AND PELVIS WITH CONTRAST TECHNIQUE: Multidetector CT imaging of the chest was performed using the standard protocol during bolus administration of intravenous contrast. Multiplanar CT image reconstructions and MIPs were obtained to evaluate the vascular anatomy. Multidetector CT imaging of the abdomen and pelvis was performed using the standard protocol during bolus administration of intravenous contrast. CONTRAST:  141m OMNIPAQUE IOHEXOL 350 MG/ML SOLN COMPARISON:  CT 01/18/2018 FINDINGS: CTA CHEST FINDINGS Cardiovascular: Large filling defect within the distal RIGHT main pulmonary artery. Findings consistent with acute thromboembolism. This embolism extends into the lower lobe pulmonary arteries where there is occlusion of the posterior segments of the RIGHT lower lobe pulmonary arteries. Occlusive thrombus extends into the RIGHT upper lobe pulmonary are which are occlusive. Filling defect defect within the LEFT lower lobe pulmonary artery which is partially occlusive. Upper lobe branch occlusion also noted (image 80/6). Overall clot burden is severe. There is evidence of RIGHT ventricular strain with the RIGHT ventricular diameter to LEFT ventricular  diameter ratio equal 1.1 (3.7: 3.5 cm) Mediastinum/Nodes: Trachea and esophagus normal.  No lymphadenopathy Lungs/Pleura: No pulmonary infarction. Subpleural reticulation throughout the lungs. No pleural fluid. No pneumothorax. There is a blood within the posterior aspect of the superior segment LEFT lower lobe. Musculoskeletal: No acute osseous abnormality. Review of the MIP images confirms the above findings. CT ABDOMEN and PELVIS FINDINGS Hepatobiliary: No focal hepatic lesion. Postcholecystectomy. No biliary dilatation. Pancreas: Pancreas is normal. No ductal dilatation. No pancreatic inflammation. Spleen: Normal spleen Adrenals/urinary tract: Adrenal glands and kidneys are normal. The ureters and bladder normal.  Stomach/Bowel: Stomach, small bowel, appendix, and cecum are normal. The colon and rectosigmoid colon are normal. Vascular/Lymphatic: Abdominal aorta is normal caliber with atherosclerotic calcification. There is no retroperitoneal or periportal lymphadenopathy. No pelvic lymphadenopathy. Reproductive: Post prostatectomy Other: No free fluid. Musculoskeletal: Degenerative osteophytosis of the spine. Review of the MIP images confirms the above findings. IMPRESSION: 1. Acute bilateral occlusive pulmonary thromboemboli. Overall clot burden is severe. 2. CT evidence of right heart strain (RV/LV Ratio = 1.1) consistent with at least submassive (intermediate risk) PE. The presence of right heart strain has been associated with an increased risk of morbidity and mortality. 3. No pulmonary infarction.  No airspace disease 4. No acute findings in the abdomen pelvis. No evidence of abdominal trauma. 5. Aortic Atherosclerosis (ICD10-I70.0). Critical Value/emergent results were called by telephone at the time of interpretation on 08/27/2019 at 7:07 am to [place] , who verbally acknowledged these results. Electronically Signed   By: Suzy Bouchard M.D.   On: 08/27/2019 07:11   CT ABDOMEN PELVIS W CONTRAST  Result Date: 08/27/2019 CLINICAL DATA:  Dizziness, fall, rib pain common abdominal trauma EXAM: CT ANGIOGRAPHY CHEST CT ABDOMEN AND PELVIS WITH CONTRAST TECHNIQUE: Multidetector CT imaging of the chest was performed using the standard protocol during bolus administration of intravenous contrast. Multiplanar CT image reconstructions and MIPs were obtained to evaluate the vascular anatomy. Multidetector CT imaging of the abdomen and pelvis was performed using the standard protocol during bolus administration of intravenous contrast. CONTRAST:  157m OMNIPAQUE IOHEXOL 350 MG/ML SOLN COMPARISON:  CT 01/18/2018 FINDINGS: CTA CHEST FINDINGS Cardiovascular: Large filling defect within the distal RIGHT main pulmonary  artery. Findings consistent with acute thromboembolism. This embolism extends into the lower lobe pulmonary arteries where there is occlusion of the posterior segments of the RIGHT lower lobe pulmonary arteries. Occlusive thrombus extends into the RIGHT upper lobe pulmonary are which are occlusive. Filling defect defect within the LEFT lower lobe pulmonary artery which is partially occlusive. Upper lobe branch occlusion also noted (image 80/6). Overall clot burden is severe. There is evidence of RIGHT ventricular strain with the RIGHT ventricular diameter to LEFT ventricular diameter ratio equal 1.1 (3.7: 3.5 cm) Mediastinum/Nodes: Trachea and esophagus normal.  No lymphadenopathy Lungs/Pleura: No pulmonary infarction. Subpleural reticulation throughout the lungs. No pleural fluid. No pneumothorax. There is a blood within the posterior aspect of the superior segment LEFT lower lobe. Musculoskeletal: No acute osseous abnormality. Review of the MIP images confirms the above findings. CT ABDOMEN and PELVIS FINDINGS Hepatobiliary: No focal hepatic lesion. Postcholecystectomy. No biliary dilatation. Pancreas: Pancreas is normal. No ductal dilatation. No pancreatic inflammation. Spleen: Normal spleen Adrenals/urinary tract: Adrenal glands and kidneys are normal. The ureters and bladder normal. Stomach/Bowel: Stomach, small bowel, appendix, and cecum are normal. The colon and rectosigmoid colon are normal. Vascular/Lymphatic: Abdominal aorta is normal caliber with atherosclerotic calcification. There is no retroperitoneal or periportal lymphadenopathy. No pelvic lymphadenopathy. Reproductive: Post prostatectomy  Other: No free fluid. Musculoskeletal: Degenerative osteophytosis of the spine. Review of the MIP images confirms the above findings. IMPRESSION: 1. Acute bilateral occlusive pulmonary thromboemboli. Overall clot burden is severe. 2. CT evidence of right heart strain (RV/LV Ratio = 1.1) consistent with at least  submassive (intermediate risk) PE. The presence of right heart strain has been associated with an increased risk of morbidity and mortality. 3. No pulmonary infarction.  No airspace disease 4. No acute findings in the abdomen pelvis. No evidence of abdominal trauma. 5. Aortic Atherosclerosis (ICD10-I70.0). Critical Value/emergent results were called by telephone at the time of interpretation on 08/27/2019 at 7:07 am to [place] , who verbally acknowledged these results. Electronically Signed   By: Suzy Bouchard M.D.   On: 08/27/2019 07:11   DG Chest Port 1 View  Result Date: 08/30/2019 CLINICAL DATA:  Fever, increased heart rate EXAM: PORTABLE CHEST 1 VIEW COMPARISON:  08/27/2019 FINDINGS: There is bilateral mild interstitial thickening likely reflecting an element of underlying chronic interstitial disease. There are patchy alveolar airspace opacities bilaterally concerning for multilobar pneumonia. There is no pleural effusion or pneumothorax. The heart and mediastinal contours are unremarkable. There is no acute osseous abnormality. IMPRESSION: 1. Bilateral interstitial and patchy alveolar airspace opacities concerning for multilobar pneumonia including atypical viral pneumonia. 2. Underlying mild chronic interstitial lung disease. Electronically Signed   By: Kathreen Devoid   On: 08/30/2019 08:07   DG Chest Port 1 View  Result Date: 08/27/2019 CLINICAL DATA:  Lt rib pain s/p fall, s/p dizziness w/ nANDv, low O2 sats, mult old lt rib fxs//acleft chest wall pain EXAM: PORTABLE CHEST 1 VIEW COMPARISON:  Radiograph 01/18/2018, CT 01/18/2018 FINDINGS: Enlarged cardiac silhouette. Low lung volumes. Prominent vascular densities in the upper lobes. No change from prior. Mild peripheral reticulation similar to prior. IMPRESSION: 1. No change from comparison exam. 2. Cardiomegaly and mild interstitial lung disease Electronically Signed   By: Suzy Bouchard M.D.   On: 08/27/2019 06:12   ECHOCARDIOGRAM  COMPLETE  Result Date: 08/28/2019   ECHOCARDIOGRAM REPORT   Patient Name:   Dustin Dunlevy Sr. Date of Exam: 08/28/2019 Medical Rec #:  [id number]           Height:       72.0 in Accession #:    [id number]          Weight:       249.1 lb Date of Birth:  January 22, 1930          BSA:          2.34 m Patient Age:    50 years            BP:           139/73 mmHg Patient Gender: M                   HR:           103 bpm. Exam Location:  Inpatient Procedure: 2D Echo, Cardiac Doppler and Color Doppler Indications:    I26.02 Pulmonary embolus  History:        Patient has prior history of Echocardiogram examinations, most                 recent 01/22/2018. Risk Factors:GERD. Cancer.  Sonographer:    Jonelle Sidle Dance Referring Phys: [place]  1. Left ventricular ejection fraction, by visual estimation, is 60 to 65%. The left ventricle has normal function. There is no left ventricular  hypertrophy.  2. Left ventricular diastolic parameters are consistent with Grade I diastolic dysfunction (impaired relaxation).  3. The left ventricle has no regional wall motion abnormalities.  4. Global right ventricle has normal systolic function.The right ventricular size is normal. No increase in right ventricular wall thickness.  5. Left atrial size was normal.  6. Right atrial size was normal.  7. Mild mitral annular calcification.  8. The mitral valve is normal in structure. No evidence of mitral valve regurgitation. No evidence of mitral stenosis.  9. The tricuspid valve is normal in structure. Tricuspid valve regurgitation is trivial. 10. The aortic valve is normal in structure. Aortic valve regurgitation is trivial. Mild aortic valve sclerosis without stenosis. 11. There is mild dilatation of the aortic root measuring 41 mm. 12. The inferior vena cava is normal in size with greater than 50% respiratory variability, suggesting right atrial pressure of 3 mmHg. 13. The tricuspid regurgitant velocity is 2.46 m/s, and  with an assumed right atrial pressure of 3 mmHg, the estimated right ventricular systolic pressure is normal at 27.2 mmHg. 14. Trivial pericardial effusion is present. FINDINGS  Left Ventricle: Left ventricular ejection fraction, by visual estimation, is 60 to 65%. The left ventricle has normal function. The left ventricle has no regional wall motion abnormalities. The left ventricular internal cavity size was the left ventricle is normal in size. There is no left ventricular hypertrophy. Left ventricular diastolic parameters are consistent with Grade I diastolic dysfunction (impaired relaxation). Right Ventricle: The right ventricular size is normal. No increase in right ventricular wall thickness. Global RV systolic function is has normal systolic function. The tricuspid regurgitant velocity is 2.46 m/s, and with an assumed right atrial pressure  of 3 mmHg, the estimated right ventricular systolic pressure is normal at 27.2 mmHg. Left Atrium: Left atrial size was normal in size. Right Atrium: Right atrial size was normal in size Pericardium: Trivial pericardial effusion is present. Mitral Valve: The mitral valve is normal in structure. There is mild calcification of the mitral valve leaflet(s). Mild mitral annular calcification. No evidence of mitral valve regurgitation. No evidence of mitral valve stenosis by observation. Tricuspid Valve: The tricuspid valve is normal in structure. Tricuspid valve regurgitation is trivial. Aortic Valve: The aortic valve is normal in structure. Aortic valve regurgitation is trivial. Mild aortic valve sclerosis is present, with no evidence of aortic valve stenosis. Pulmonic Valve: The pulmonic valve was normal in structure. Pulmonic valve regurgitation is not visualized. Pulmonic regurgitation is not visualized. Aorta: Aortic dilatation noted. There is mild dilatation of the aortic root measuring 41 mm. Venous: The inferior vena cava is normal in size with greater than 50%  respiratory variability, suggesting right atrial pressure of 3 mmHg. IAS/Shunts: No atrial level shunt detected by color flow Doppler.  LEFT VENTRICLE PLAX 2D LVIDd:         4.70 cm LVIDs:         2.70 cm LV PW:         1.10 cm LV IVS:        1.20 cm LVOT diam:     2.20 cm LV SV:         75 ml LV SV Index:   30.99 LVOT Area:     3.80 cm  RIGHT VENTRICLE             IVC RV Basal diam:  2.30 cm     IVC diam: 1.70 cm RV S prime:     19.40 cm/s TAPSE (  M-mode): 1.7 cm LEFT ATRIUM             Index       RIGHT ATRIUM           Index LA diam:        4.80 cm 2.05 cm/m  RA Area:     13.40 cm LA Vol (A2C):   72.6 ml 31.04 ml/m RA Volume:   26.70 ml  11.42 ml/m LA Vol (A4C):   58.1 ml 24.84 ml/m LA Biplane Vol: 66.1 ml 28.26 ml/m  AORTIC VALVE LVOT Vmax:   117.00 cm/s LVOT Vmean:  78.400 cm/s LVOT VTI:    0.221 m  AORTA Ao Root diam: 4.60 cm Ao Asc diam:  3.70 cm MV A velocity: 126.00 cm/s 70.3 cm/s TRICUSPID VALVE                                      TR Peak grad:   24.2 mmHg                                      TR Vmax:        246.00 cm/s                                       SHUNTS                                      Systemic VTI:  0.22 m                                      Systemic Diam: 2.20 cm  Loralie Champagne MD Electronically signed by Loralie Champagne MD Signature Date/Time: 08/28/2019/3:30:44 PM    Final    VAS [place] LOWER EXTREMITY VENOUS (DVT)  Result Date: 08/28/2019  Lower Venous Study Indications: Pulmonary embolism.  Risk Factors: Confirmed PE. Anticoagulation: Heparin. Limitations: Body habitus, poor ultrasound/tissue interface, patient positioning, open wound and bandages. Comparison Study: No prior studies. Performing Technologist: Oliver Hum RVT  Examination Guidelines: A complete evaluation includes B-mode imaging, spectral Doppler, color Doppler, and power Doppler as needed of all accessible portions of each vessel. Bilateral testing is considered an integral part of a complete examination.  Limited examinations for reoccurring indications may be performed as noted.  +---------+---------------+---------+-----------+----------+--------------+ RIGHT    CompressibilityPhasicitySpontaneityPropertiesThrombus Aging +---------+---------------+---------+-----------+----------+--------------+ CFV      Full           Yes      Yes                                 +---------+---------------+---------+-----------+----------+--------------+ SFJ      Full                                                        +---------+---------------+---------+-----------+----------+--------------+ FV Prox  Full                                                        +---------+---------------+---------+-----------+----------+--------------+  FV Mid   Full                                                        +---------+---------------+---------+-----------+----------+--------------+ FV DistalFull                                                        +---------+---------------+---------+-----------+----------+--------------+ PFV      Full                                                        +---------+---------------+---------+-----------+----------+--------------+ POP      Full           Yes      Yes                                 +---------+---------------+---------+-----------+----------+--------------+ PTV      Full                                                        +---------+---------------+---------+-----------+----------+--------------+ PERO     Full                                                        +---------+---------------+---------+-----------+----------+--------------+   +---------+---------------+---------+-----------+----------+--------------+ LEFT     CompressibilityPhasicitySpontaneityPropertiesThrombus Aging +---------+---------------+---------+-----------+----------+--------------+ CFV      Full           Yes      Yes                                  +---------+---------------+---------+-----------+----------+--------------+ SFJ      Full                                                        +---------+---------------+---------+-----------+----------+--------------+ FV Prox  Full                                                        +---------+---------------+---------+-----------+----------+--------------+ FV Mid   Full                                                        +---------+---------------+---------+-----------+----------+--------------+  FV DistalNone           No       No                   Acute          +---------+---------------+---------+-----------+----------+--------------+ PFV      Full                                                        +---------+---------------+---------+-----------+----------+--------------+ POP      Partial        Yes      Yes                  Acute          +---------+---------------+---------+-----------+----------+--------------+ PTV      Full                                                        +---------+---------------+---------+-----------+----------+--------------+ PERO                                                  Not visualized +---------+---------------+---------+-----------+----------+--------------+     Summary: Right: There is no evidence of deep vein thrombosis in the lower extremity. No cystic structure found in the popliteal fossa. Left: Findings consistent with acute deep vein thrombosis involving the left femoral vein, and left popliteal vein. No cystic structure found in the popliteal fossa.  *See table(s) above for measurements and observations. Electronically signed by Ruta Hinds MD on 08/28/2019 at 2:51:08 PM.    Final      The results of significant diagnostics from this hospitalization (including imaging, microbiology, ancillary and laboratory) are listed below for reference.      Microbiology: Recent Results (from the past 240 hour(s))  SARS Coronavirus 2 Ag (30 min TAT) - Nasal Swab (BD Veritor Kit)     Status: None   Collection Time: 08/27/19  5:22 AM   Specimen: Nasal Swab (BD Veritor Kit)  Result Value Ref Range Status   SARS Coronavirus 2 Ag NEGATIVE NEGATIVE Final    Comment: (NOTE) SARS-CoV-2 antigen NOT DETECTED.  Negative results are presumptive.  Negative results do not preclude SARS-CoV-2 infection and should not be used as the sole basis for treatment or other patient management decisions, including infection  control decisions, particularly in the presence of clinical signs and  symptoms consistent with COVID-19, or in those who have been in contact with the virus.  Negative results must be combined with clinical observations, patient history, and epidemiological information. The expected result is Negative. Fact Sheet for Patients: PodPark.tn Fact Sheet for Healthcare Providers: GiftContent.is This test is not yet approved or cleared by the [place] FDA and  has been authorized for detection and/or diagnosis of SARS-CoV-2 by FDA under an Emergency Use Authorization (EUA).  This EUA will remain in effect (meaning this test can be used) for the duration of  the COVID-19 de claration under Section 564(b)(1) of the Act, 21 U.S.C. section 360bbb-3(b)(1),  unless the authorization is terminated or revoked sooner. Performed at [facility], [address] [place], [place] [address]   SARS CORONAVIRUS 2 (TAT 6-24 HRS) Nasopharyngeal Nasopharyngeal Swab     Status: None   Collection Time: 08/27/19  8:00 AM   Specimen: Nasopharyngeal Swab  Result Value Ref Range Status   SARS Coronavirus 2 NEGATIVE NEGATIVE Final    Comment: (NOTE) SARS-CoV-2 target nucleic acids are NOT DETECTED. The SARS-CoV-2 RNA is generally detectable in upper and lower respiratory specimens during  the acute phase of infection. Negative results do not preclude SARS-CoV-2 infection, do not rule out co-infections with other pathogens, and should not be used as the sole basis for treatment or other patient management decisions. Negative results must be combined with clinical observations, patient history, and epidemiological information. The expected result is Negative. Fact Sheet for Patients: SugarRoll.be Fact Sheet for Healthcare Providers: https://www.woods-mathews.com/ This test is not yet approved or cleared by the [place] FDA and  has been authorized for detection and/or diagnosis of SARS-CoV-2 by FDA under an Emergency Use Authorization (EUA). This EUA will remain  in effect (meaning this test can be used) for the duration of the COVID-19 declaration under Section 56 4(b)(1) of the Act, 21 U.S.C. section 360bbb-3(b)(1), unless the authorization is terminated or revoked sooner. Performed at [facility], [place] [address], [place], [place] [address]   Urine Culture     Status: Abnormal   Collection Time: 08/30/19  7:06 AM   Specimen: Urine, Random  Result Value Ref Range Status   Specimen Description   Final    URINE, RANDOM Performed at [facility] [address], [place], [place] [address]    Special Requests   Final    NONE Performed at [facility], [place] [address] [place], [place] [address]    Culture 50,000 COLONIES/mL CITROBACTER KOSERI (A)  Final   Report Status 09/01/2019 FINAL  Final   Organism ID, Bacteria CITROBACTER KOSERI (A)  Final      Susceptibility   Citrobacter koseri - MIC*    CEFAZOLIN <=4 SENSITIVE Sensitive     CEFTRIAXONE <=0.25 SENSITIVE Sensitive     CIPROFLOXACIN <=0.25 SENSITIVE Sensitive     GENTAMICIN <=1 SENSITIVE Sensitive     IMIPENEM <=0.25 SENSITIVE Sensitive     NITROFURANTOIN <=16 SENSITIVE Sensitive     TRIMETH/SULFA <=20 SENSITIVE  Sensitive     PIP/TAZO <=4 SENSITIVE Sensitive     * 50,000 COLONIES/mL CITROBACTER KOSERI  Culture, blood (routine x 2)     Status: None   Collection Time: 08/30/19  7:26 AM   Specimen: BLOOD RIGHT HAND  Result Value Ref Range Status   Specimen Description   Final    BLOOD RIGHT HAND Performed at [facility] [address], [place], [place] [address]    Special Requests   Final    BOTTLES DRAWN AEROBIC ONLY Blood Culture results may not be optimal due to an inadequate volume of blood received in culture bottles Performed at [facility] [address] [place], [place] [address]    Culture   Final    NO GROWTH 5 DAYS Performed at [facility], [place] [address] [place], [place] [address]    Report Status 09/04/2019 FINAL  Final  Culture, blood (routine x 2)     Status: None   Collection Time: 08/30/19  7:26 AM   Specimen: BLOOD LEFT HAND  Result Value Ref Range Status   Specimen Description   Final    BLOOD LEFT HAND Performed at [facility] [address] [place], [place] [address]    Special Requests   Final    BOTTLES DRAWN AEROBIC ONLY Blood Culture adequate volume Performed at [facility] [address], [place], [place] [address]    Culture   Final    NO GROWTH 5 DAYS Performed at [facility], [place] [address] [place], [place] [address]    Report Status 09/04/2019 FINAL  Final  MRSA PCR Screening     Status: None   Collection Time: 08/30/19  8:02 AM   Specimen: Nasopharyngeal Swab  Result Value Ref Range Status   MRSA by PCR NEGATIVE NEGATIVE Final    Comment:        The GeneXpert MRSA Assay (FDA approved for NASAL specimens only), is one component of a comprehensive MRSA colonization surveillance program. It is not intended to diagnose MRSA infection nor to guide or monitor treatment for MRSA infections. Performed at [facility], [place] [address], [place], [place] [address]   SARS CORONAVIRUS 2 (TAT 6-24 HRS) Nasopharyngeal Nasopharyngeal Swab     Status: Abnormal   Collection Time: 08/30/19  2:44 PM   Specimen: Nasopharyngeal Swab  Result Value Ref Range Status   SARS Coronavirus 2 POSITIVE (A) NEGATIVE Final    Comment: RESULT CALLED TO, READ BACK BY AND VERIFIED WITH: RN Danley Danker AT 0025 BY MESSAN H. ON 08/31/2019 (NOTE) SARS-CoV-2 target nucleic acids are DETECTED. The SARS-CoV-2 RNA is generally detectable in upper and lower respiratory specimens during the acute phase of infection. Positive results are indicative of the presence of SARS-CoV-2 RNA. Clinical correlation with patient history and other diagnostic information is  necessary to determine patient infection status. Positive results do not rule out bacterial infection or co-infection with other viruses.  The expected result is Negative. Fact Sheet for Patients: SugarRoll.be Fact Sheet for Healthcare Providers: https://www.woods-mathews.com/ This test is not yet approved or cleared by the [place] FDA and  has been authorized for detection and/or diagnosis of SARS-CoV-2 by FDA under an Emergency Use Authorization (EUA). This EUA will remain  in effect (meaning this test can be  used) for the duration of the COVID-19 declaration under Section 564(b)(1) of the Act, 21 U.S.C. section 360bbb-3(b)(1), unless the authorization is terminated or revoked sooner. Performed at [facility], [place] [address], [place], [place] [address]      Labs: BNP (last 3 results) Recent Labs    08/27/19 0522  BNP 03.5   Basic Metabolic Panel: Recent Labs  Lab 08/31/19 0315 09/01/19 0313 09/02/19 0326 09/03/19 0352 09/04/19 0358  NA 134* 139 139 139 140  K 4.1 4.4 4.1 4.3 4.0  CL 101 106 103 106 106  CO2 '23 23 28 27 26  '$ GLUCOSE 109* 111* 106* 105* 95  BUN 18 25* 31* 26* 27*  CREATININE 0.91 0.80 0.83 0.80 0.88   CALCIUM 8.4* 8.8* 8.8* 8.6* 8.5*   Liver Function Tests: Recent Labs  Lab 09/01/19 0313 09/02/19 0326 09/03/19 0352  AST 38 31 41  ALT 31 27 38  ALKPHOS 57 54 55  BILITOT 0.5 0.5 0.7  PROT 6.2* 5.8* 5.9*  ALBUMIN 3.3* 3.1* 3.2*   No results for input(s): LIPASE, AMYLASE in the last 168 hours. No results for input(s): AMMONIA in the last 168 hours. CBC: Recent Labs  Lab 08/29/19 0406 08/30/19 0458 08/31/19 0315 09/02/19 0326 09/04/19 0358  WBC 5.0 5.2 7.4 6.2 6.7  HGB 14.1 13.9 13.5 12.9* 13.1  HCT 41.9 41.6 40.3 39.3 39.2  MCV 90.3 91.4 89.2 90.1 90.1  PLT 148* 142* 144* 168 210   Cardiac Enzymes: No results for input(s): CKTOTAL, CKMB, CKMBINDEX, TROPONINI in the last 168 hours. BNP: Invalid input(s): POCBNP CBG: No results for input(s): GLUCAP in the last 168 hours. D-Dimer Recent Labs    09/03/19 0352 09/04/19 0358  DDIMER 1.86* 1.82*   Hgb A1c No results for input(s): HGBA1C in the last 72 hours. Lipid Profile No results for input(s): CHOL, HDL, LDLCALC, TRIG, CHOLHDL, LDLDIRECT in the last 72 hours. Thyroid function studies No results for input(s): TSH, T4TOTAL, T3FREE, THYROIDAB in the last 72 hours.  Invalid input(s): FREET3 Anemia work up Recent Labs    09/03/19 0352 09/04/19 0358  FERRITIN 386* 349*   Urinalysis    Component Value Date/Time   COLORURINE YELLOW 08/30/2019 0706   APPEARANCEUR CLEAR 08/30/2019 0706   LABSPEC 1.018 08/30/2019 0706   PHURINE 5.0 08/30/2019 0706   GLUCOSEU NEGATIVE 08/30/2019 0706   HGBUR LARGE (A) 08/30/2019 0706   BILIRUBINUR NEGATIVE 08/30/2019 0706   KETONESUR 5 (A) 08/30/2019 0706   PROTEINUR NEGATIVE 08/30/2019 0706   NITRITE NEGATIVE 08/30/2019 0706   LEUKOCYTESUR NEGATIVE 08/30/2019 0706   Sepsis Labs Invalid input(s): PROCALCITONIN,  WBC,  LACTICIDVEN Microbiology Recent Results (from the past 240 hour(s))  SARS Coronavirus 2 Ag (30 min TAT) - Nasal Swab (BD Veritor Kit)     Status: None    Collection Time: 08/27/19  5:22 AM   Specimen: Nasal Swab (BD Veritor Kit)  Result Value Ref Range Status   SARS Coronavirus 2 Ag NEGATIVE NEGATIVE Final    Comment: (NOTE) SARS-CoV-2 antigen NOT DETECTED.  Negative results are presumptive.  Negative results do not preclude SARS-CoV-2 infection and should not be used as the sole basis for treatment or other patient management decisions, including infection  control decisions, particularly in the presence of clinical signs and  symptoms consistent with COVID-19, or in those who have been in contact with the virus.  Negative results must be combined with clinical observations, patient history, and epidemiological information. The expected result is Negative. Fact Sheet for Patients: PodPark.tn Fact Sheet for Healthcare Providers: GiftContent.is This test is not yet approved or cleared by the [place] FDA and  has been authorized for detection and/or diagnosis of SARS-CoV-2 by FDA under an Emergency Use Authorization (EUA).  This EUA will remain in effect (meaning this test can be used) for the duration of  the COVID-19 de claration under Section 564(b)(1) of the Act, 21 U.S.C. section 360bbb-3(b)(1), unless the authorization is terminated or revoked sooner. Performed at [facility], [address] [place], [place] [address]   SARS CORONAVIRUS 2 (TAT 6-24 HRS) Nasopharyngeal Nasopharyngeal Swab     Status: None   Collection Time: 08/27/19  8:00 AM   Specimen: Nasopharyngeal Swab  Result Value Ref Range Status   SARS Coronavirus 2 NEGATIVE NEGATIVE Final    Comment: (NOTE) SARS-CoV-2 target nucleic acids are NOT DETECTED. The SARS-CoV-2 RNA is generally detectable in upper and lower respiratory specimens during the acute phase of infection. Negative results do not preclude SARS-CoV-2 infection, do not rule out co-infections with other pathogens, and should  not be used as the sole basis for treatment or other patient management decisions. Negative results must be  combined with clinical observations, patient history, and epidemiological information. The expected result is Negative. Fact Sheet for Patients: SugarRoll.be Fact Sheet for Healthcare Providers: https://www.woods-mathews.com/ This test is not yet approved or cleared by the [place] FDA and  has been authorized for detection and/or diagnosis of SARS-CoV-2 by FDA under an Emergency Use Authorization (EUA). This EUA will remain  in effect (meaning this test can be used) for the duration of the COVID-19 declaration under Section 56 4(b)(1) of the Act, 21 U.S.C. section 360bbb-3(b)(1), unless the authorization is terminated or revoked sooner. Performed at [facility], [place] [address] [place], [place] [address]   Urine Culture     Status: Abnormal   Collection Time: 08/30/19  7:06 AM   Specimen: Urine, Random  Result Value Ref Range Status   Specimen Description   Final    URINE, RANDOM Performed at [facility] [address], [place], [place] [address]    Special Requests   Final    NONE Performed at [facility], [place] [address] [place], [place] [address]    Culture 50,000 COLONIES/mL CITROBACTER KOSERI (A)  Final   Report Status 09/01/2019 FINAL  Final   Organism ID, Bacteria CITROBACTER KOSERI (A)  Final      Susceptibility   Citrobacter koseri - MIC*    CEFAZOLIN <=4 SENSITIVE Sensitive     CEFTRIAXONE <=0.25 SENSITIVE Sensitive     CIPROFLOXACIN <=0.25 SENSITIVE Sensitive     GENTAMICIN <=1 SENSITIVE Sensitive     IMIPENEM <=0.25 SENSITIVE Sensitive     NITROFURANTOIN <=16 SENSITIVE Sensitive     TRIMETH/SULFA <=20 SENSITIVE Sensitive     PIP/TAZO <=4 SENSITIVE Sensitive     * 50,000 COLONIES/mL CITROBACTER KOSERI  Culture, blood (routine x 2)     Status: None   Collection  Time: 08/30/19  7:26 AM   Specimen: BLOOD RIGHT HAND  Result Value Ref Range Status   Specimen Description   Final    BLOOD RIGHT HAND Performed at [facility] [address], [place], [place] [address]    Special Requests   Final    BOTTLES DRAWN AEROBIC ONLY Blood Culture results may not be optimal due to an inadequate volume of blood received in culture bottles Performed at [facility] [address] [place], [place] [address]    Culture   Final    NO GROWTH 5 DAYS Performed at [facility], [place] [address], [place], [place] [address]    Report Status 09/04/2019 FINAL  Final  Culture, blood (routine x 2)     Status: None   Collection Time: 08/30/19  7:26 AM   Specimen: BLOOD LEFT HAND  Result Value Ref Range Status   Specimen Description   Final    BLOOD LEFT HAND Performed at [facility] [address], [place], [place] [address]    Special Requests   Final    BOTTLES DRAWN AEROBIC ONLY Blood Culture adequate volume Performed at [facility] [address], [place], [place] [address]    Culture   Final    NO GROWTH 5 DAYS Performed at [facility], [place] [address], [place],  [address]    Report Status 09/04/2019 FINAL  Final  MRSA PCR Screening     Status: None   Collection Time: 08/30/19  8:02 AM   Specimen: Nasopharyngeal Swab  Result Value Ref Range Status   MRSA  by PCR NEGATIVE NEGATIVE Final    Comment:        The GeneXpert MRSA Assay (FDA approved for NASAL specimens only), is one component of a comprehensive MRSA colonization surveillance program. It is not intended to diagnose MRSA infection nor to guide or monitor treatment for MRSA infections. Performed at [facility], [place] [address] [place], [place] [address]   SARS CORONAVIRUS 2 (TAT 6-24 HRS) Nasopharyngeal Nasopharyngeal Swab     Status: Abnormal   Collection Time: 08/30/19   2:44 PM   Specimen: Nasopharyngeal Swab  Result Value Ref Range Status   SARS Coronavirus 2 POSITIVE (A) NEGATIVE Final    Comment: RESULT CALLED TO, READ BACK BY AND VERIFIED WITH: RN Danley Danker AT 0025 BY MESSAN H. ON 08/31/2019 (NOTE) SARS-CoV-2 target nucleic acids are DETECTED. The SARS-CoV-2 RNA is generally detectable in upper and lower respiratory specimens during the acute phase of infection. Positive results are indicative of the presence of SARS-CoV-2 RNA. Clinical correlation with patient history and other diagnostic information is  necessary to determine patient infection status. Positive results do not rule out bacterial infection or co-infection with other viruses.  The expected result is Negative. Fact Sheet for Patients: SugarRoll.be Fact Sheet for Healthcare Providers: https://www.woods-mathews.com/ This test is not yet approved or cleared by the [place] FDA and  has been authorized for detection and/or diagnosis of SARS-CoV-2 by FDA under an Emergency Use Authorization (EUA). This EUA will remain  in effect (meaning this test can be  used) for the duration of the COVID-19 declaration under Section 564(b)(1) of the Act, 21 U.S.C. section 360bbb-3(b)(1), unless the authorization is terminated or revoked sooner. Performed at [facility], [place] [address], [place], [place] [address]      Time coordinating discharge in minutes: 65  SIGNED:   Debbe Odea, MD  [facility] 09/04/2019, 10:33 AM Pager   If 7PM-7AM, please contact night-coverage [email or web address] Password [email or web address]

## 2019-09-04 NOTE — Care Management Important Message (Signed)
Important Message  Patient Details IM Letter given to Gabriel Earing RN Case Manager to present to the Patient Name: Dustin Trupp Sr. MRN: [id number] Date of Birth: [birth year]   Medicare Important Message Given:  Yes     Kerin Salen 09/04/2019, 10:38 AM

## 2021-03-10 DEATH — deceased

## 2021-10-31 IMAGING — CT CT ANGIO CHEST
3 of 13 series · 13 of 37 positions shown · IV contrast (omnipaque)
Comparison: CT 01/18/2018

CLINICAL DATA: Dizziness, fall, rib pain common abdominal trauma

EXAM:
CT ANGIOGRAPHY CHEST
CT ABDOMEN AND PELVIS WITH CONTRAST
TECHNIQUE: Multidetector CT imaging of the chest was performed using the
standard protocol during bolus administration of intravenous
contrast. Multiplanar CT image reconstructions and MIPs were
obtained to evaluate the vascular anatomy. Multidetector CT imaging
of the abdomen and pelvis was performed using the standard protocol
during bolus administration of intravenous contrast.
CONTRAST:  100mL OMNIPAQUE IOHEXOL 350 MG/ML SOLN

[Series 4: pe axial st · axial · 0.94mm/px · z∈[-180,-90]mm · 2 of 90 slices shown]
[im 30/90  lung]
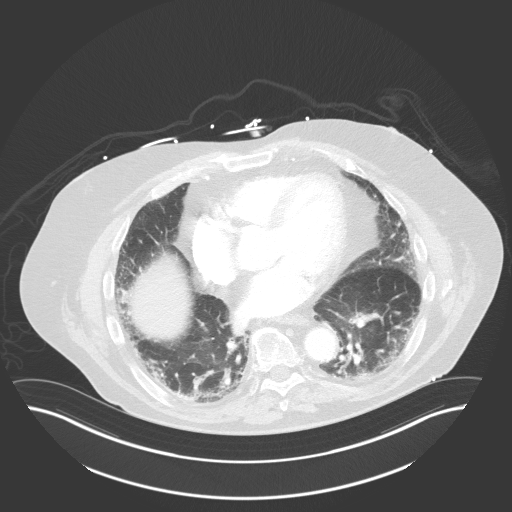
[im 60/90  lung]
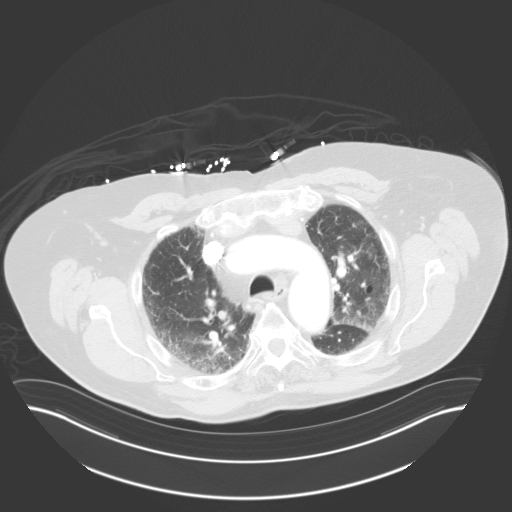

[Series 10: pe thins · axial · 0.94mm/px · z∈[-247,-23]mm · 8 of 270 slices shown]
[im 23/270  lung]
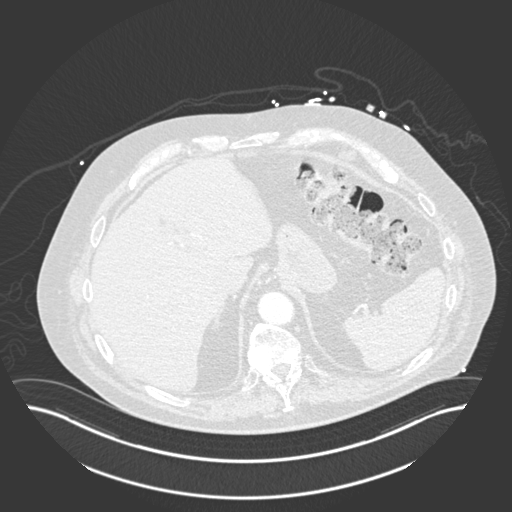
[im 68/270  lung]
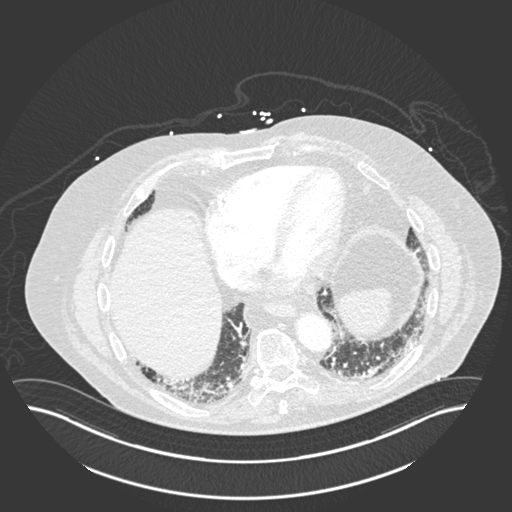
[im 90/270  lung]
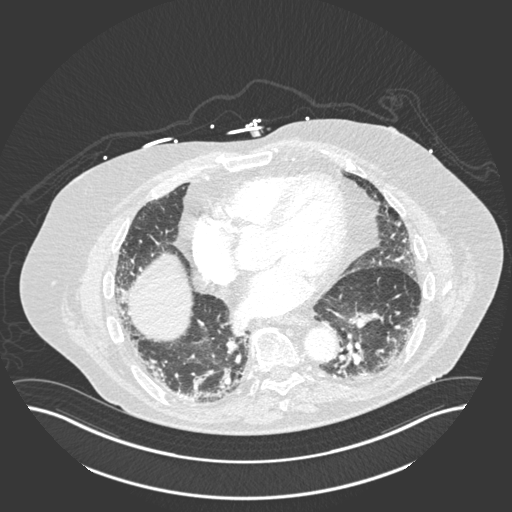
[im 113/270  lung]
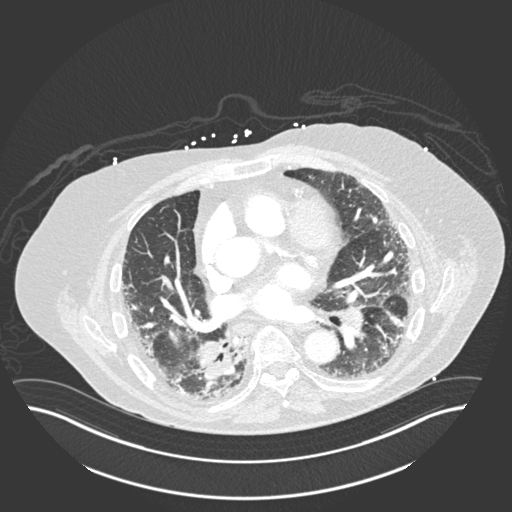
[im 157/270  lung]
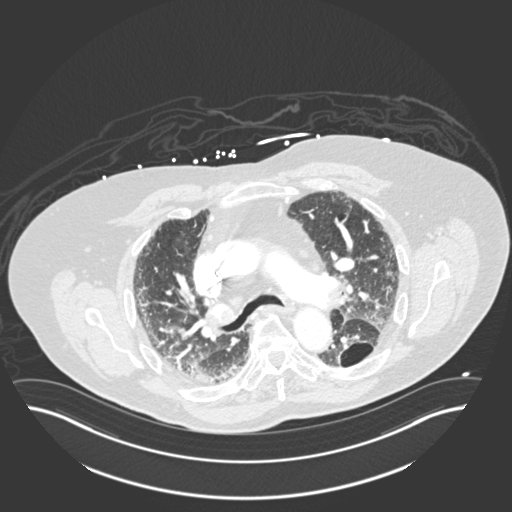
[im 180/270  lung]
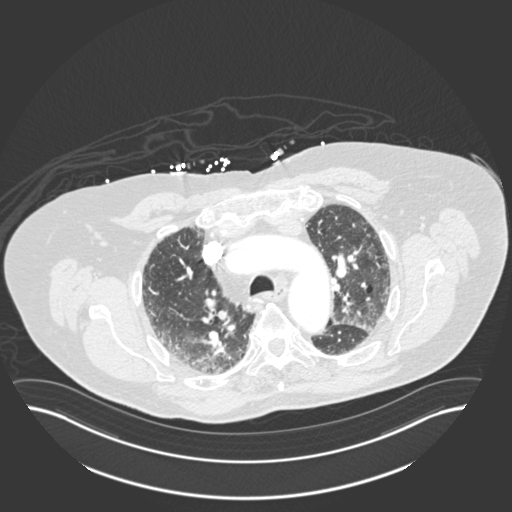
[im 202/270  lung]
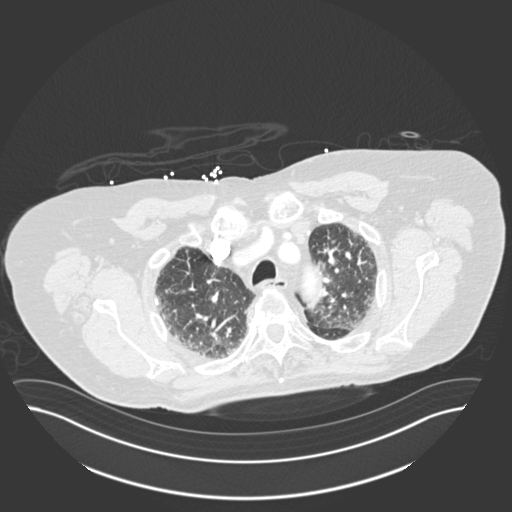
[im 247/270  lung]
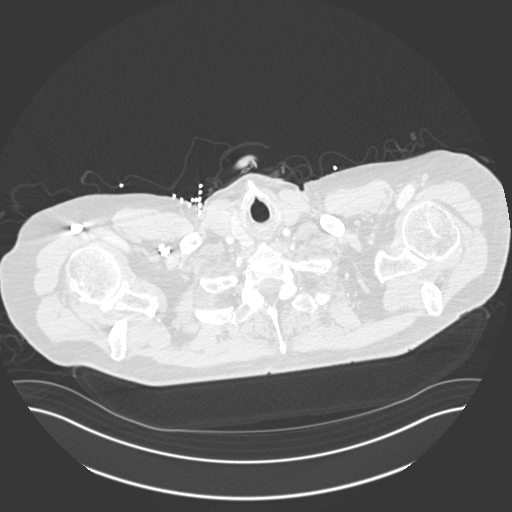

[Series 11: axial st · axial · 0.98mm/px · z∈[-555,-285]mm · 3 of 108 slices shown]
[im 27/108  lung]
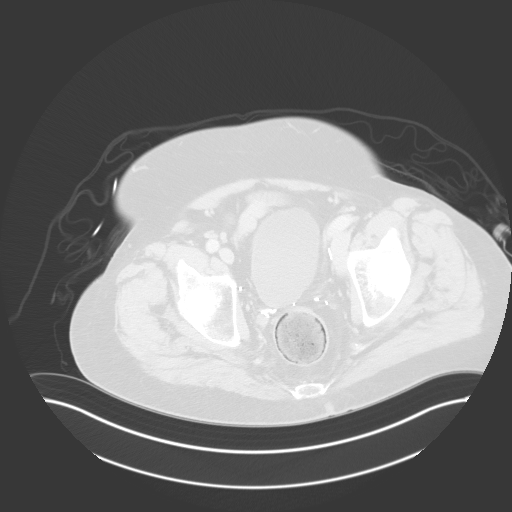
[im 54/108  mediastinal]
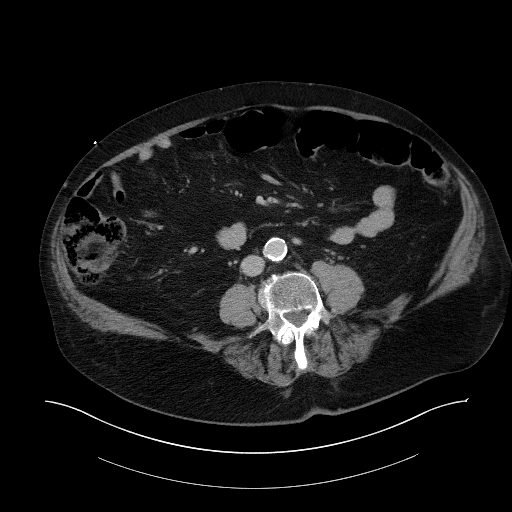
[im 81/108  lung]
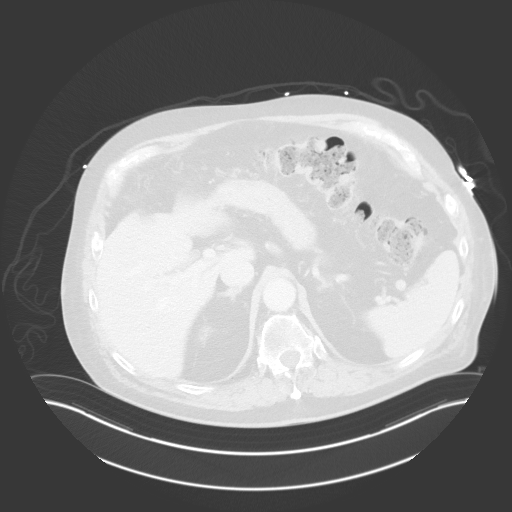

[13 of 37 positions shown; findings below may reference images not displayed]

FINDINGS: CTA CHEST FINDINGS

Cardiovascular: Large filling defect within the distal RIGHT main
pulmonary artery. Findings consistent with acute thromboembolism.
This embolism extends into the lower lobe pulmonary arteries where
there is occlusion of the posterior segments of the RIGHT lower lobe
pulmonary arteries. Occlusive thrombus extends into the RIGHT upper
lobe pulmonary are which are occlusive.

Filling defect defect within the LEFT lower lobe pulmonary artery
which is partially occlusive. Upper lobe branch occlusion also noted
(image 80/6).

Overall clot burden is severe.

There is evidence of RIGHT ventricular strain with the RIGHT
ventricular diameter to LEFT ventricular diameter ratio equal
(3.7: 3.5 cm)

Mediastinum/Nodes: Trachea and esophagus normal.  No lymphadenopathy

Lungs/Pleura: No pulmonary infarction. Subpleural reticulation
throughout the lungs. No pleural fluid. No pneumothorax. There is a
blood within the posterior aspect of the superior segment LEFT lower
lobe.

Musculoskeletal: No acute osseous abnormality.

Review of the MIP images confirms the above findings.

CT ABDOMEN and PELVIS FINDINGS

Hepatobiliary: No focal hepatic lesion. Postcholecystectomy. No
biliary dilatation.

Pancreas: Pancreas is normal. No ductal dilatation. No pancreatic
inflammation.

Spleen: Normal spleen

Adrenals/urinary tract: Adrenal glands and kidneys are normal. The
ureters and bladder normal.

Stomach/Bowel: Stomach, small bowel, appendix, and cecum are normal.
The colon and rectosigmoid colon are normal.

Vascular/Lymphatic: Abdominal aorta is normal caliber with
atherosclerotic calcification. There is no retroperitoneal or
periportal lymphadenopathy. No pelvic lymphadenopathy.

Reproductive: Post prostatectomy

Other: No free fluid.

Musculoskeletal: Degenerative osteophytosis of the spine.

Review of the MIP images confirms the above findings.
IMPRESSION: 1. Acute bilateral occlusive pulmonary thromboemboli. Overall clot
burden is severe.
2. CT evidence of right heart strain (RV/LV Ratio = 1.1) consistent
with at least submassive (intermediate risk) PE. The presence of
right heart strain has been associated with an increased risk of
morbidity and mortality.
3. No pulmonary infarction.  No airspace disease
4. No acute findings in the abdomen pelvis. No evidence of abdominal
trauma.
5. Aortic Atherosclerosis ([id number]).

Critical Value/emergent results were called by telephone at the time
of interpretation on 08/27/2019 at [DATE] to Nima Kano , who
verbally acknowledged these results.

## 2021-11-03 IMAGING — DX DG CHEST 1V PORT
2 series · 2 of 2 positions shown · non-contrast
Comparison: 08/27/2019

CLINICAL DATA: Fever, increased heart rate

EXAM:
PORTABLE CHEST 1 VIEW

[chest ap (1 of 2)]
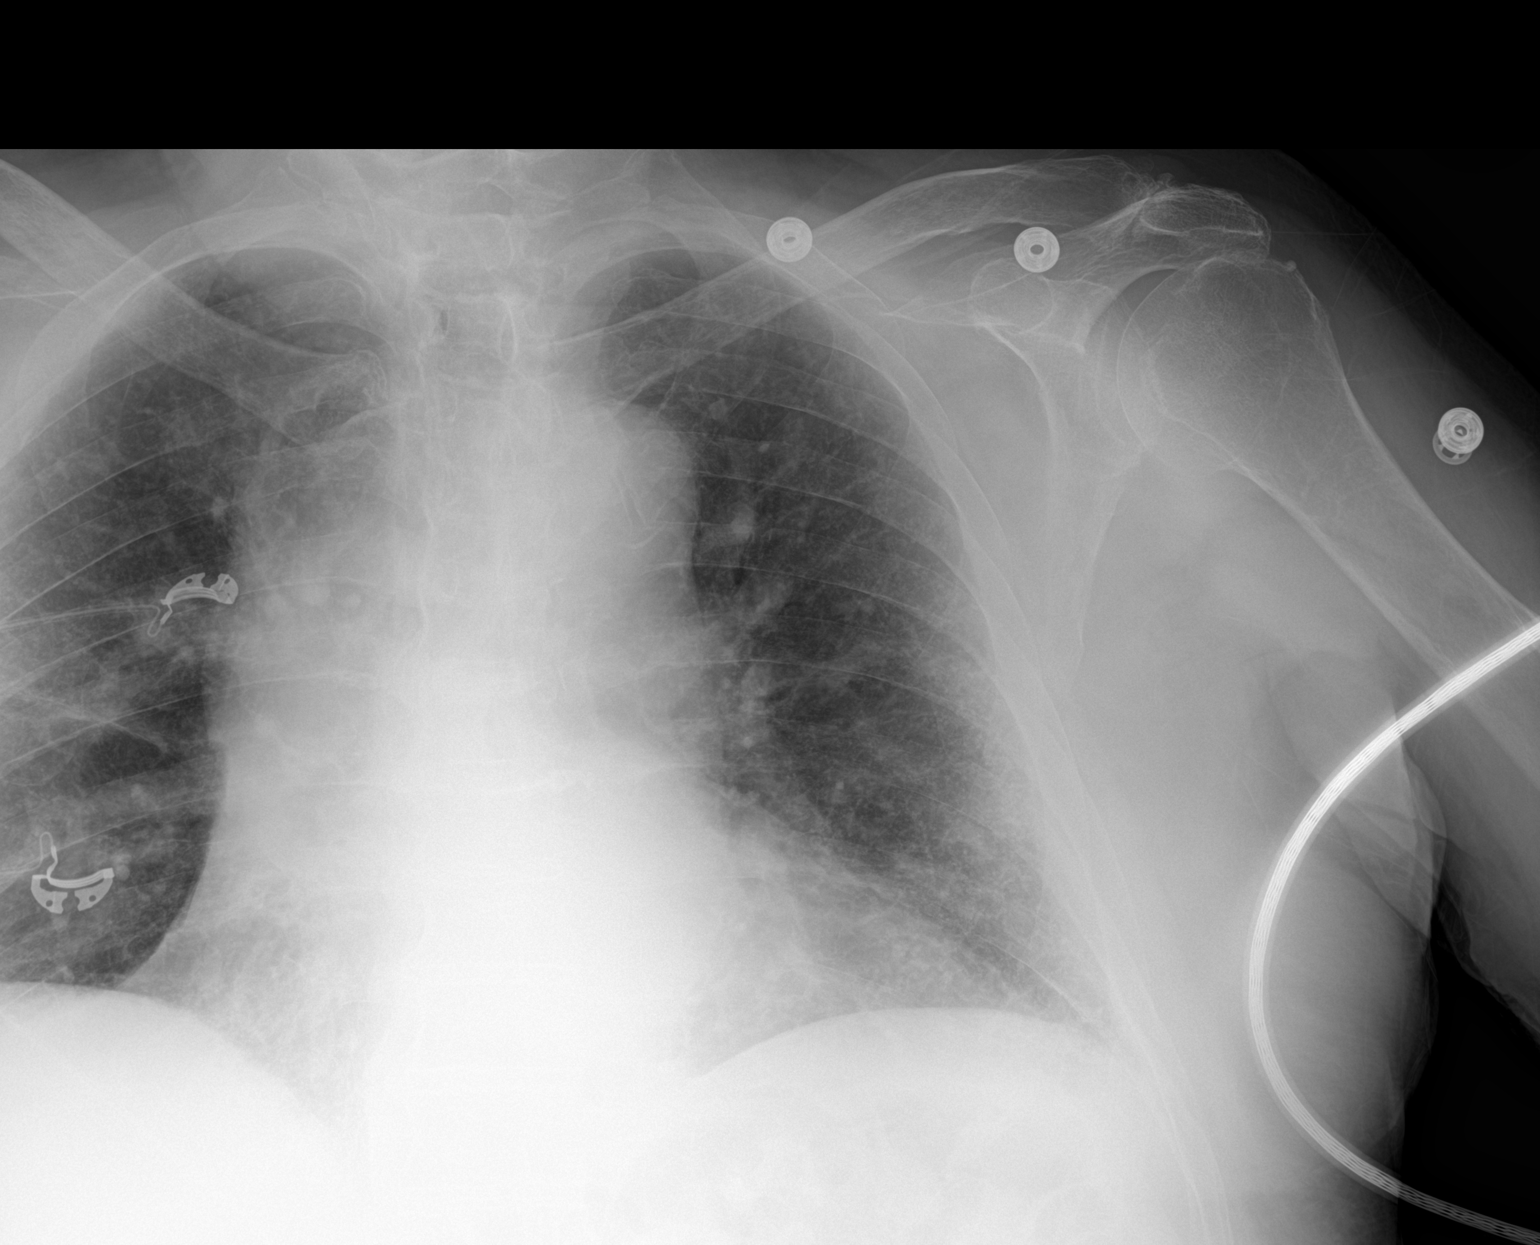

[chest ap (2 of 2)]
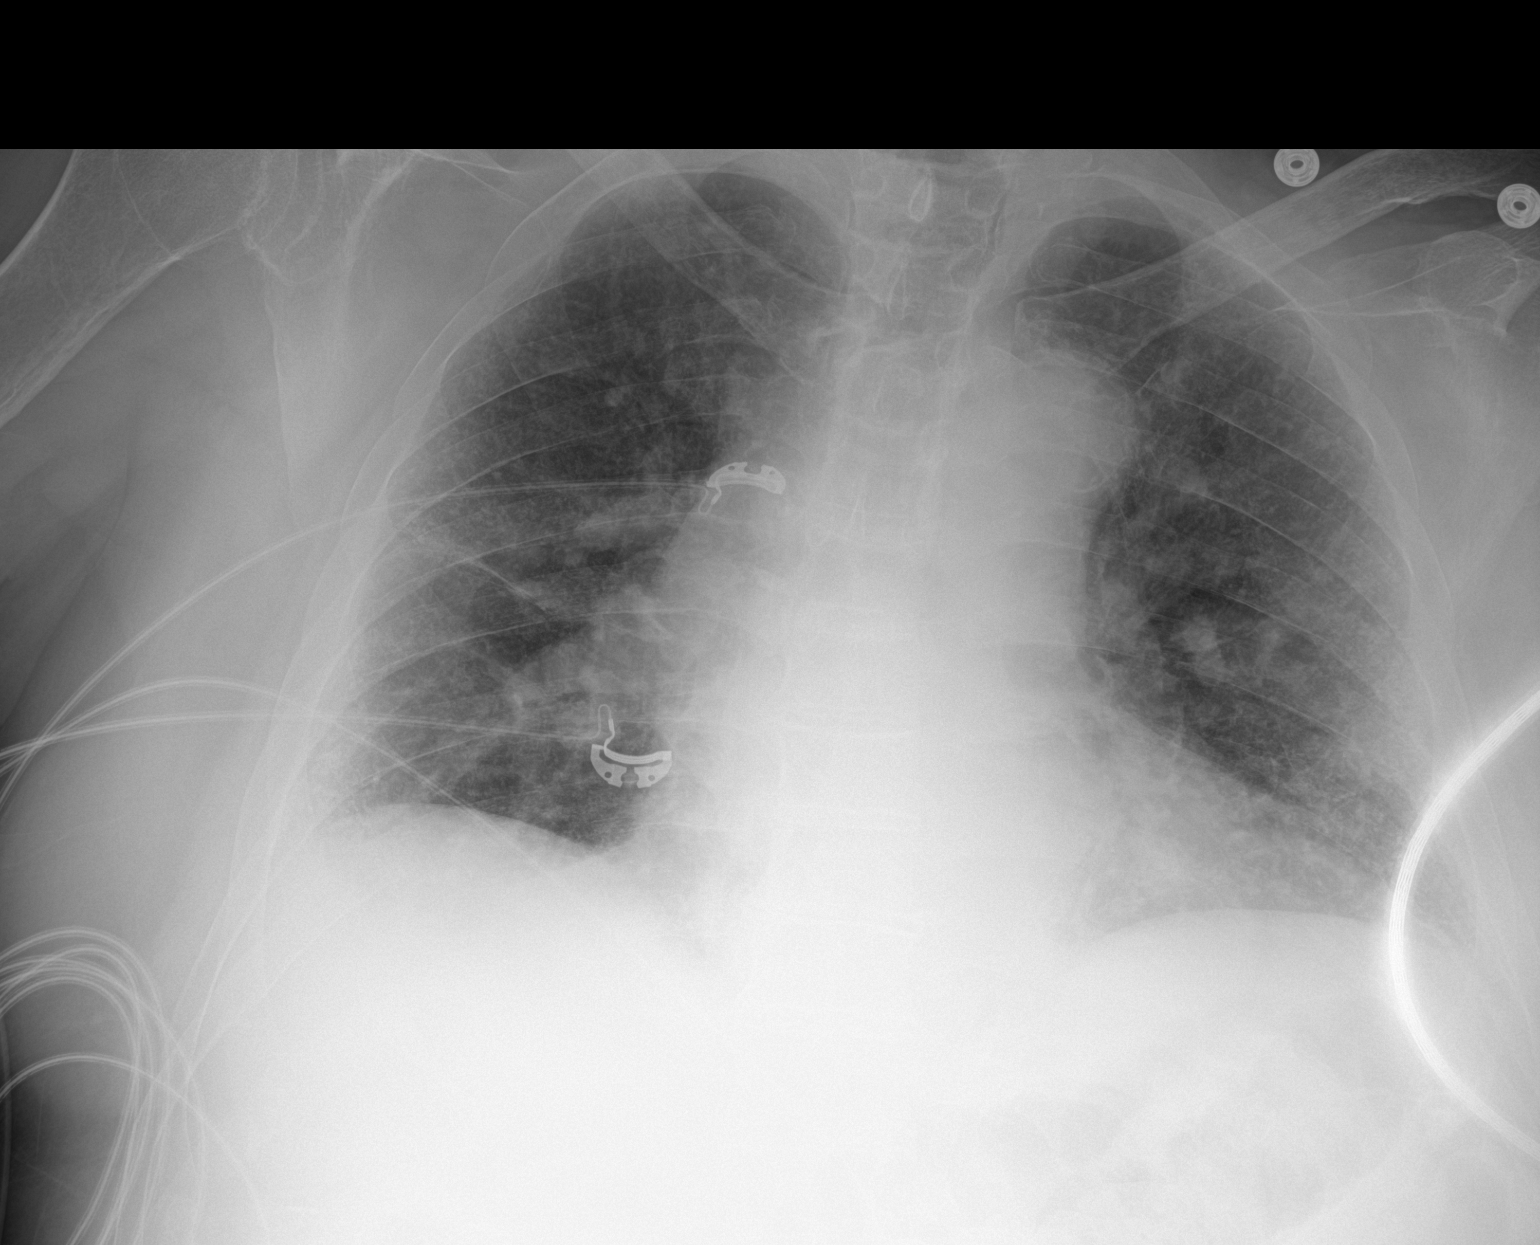

[2 of 2 positions shown; findings below may reference images not displayed]

FINDINGS: There is bilateral mild interstitial thickening likely reflecting an
element of underlying chronic interstitial disease. There are patchy
alveolar airspace opacities bilaterally concerning for multilobar
pneumonia. There is no pleural effusion or pneumothorax. The heart
and mediastinal contours are unremarkable.

There is no acute osseous abnormality.
IMPRESSION: 1. Bilateral interstitial and patchy alveolar airspace opacities
concerning for multilobar pneumonia including atypical viral
pneumonia.
2. Underlying mild chronic interstitial lung disease.
